# Patient Record
Sex: Female | Born: 1975 | Race: Black or African American | Hispanic: No | Smoking: Never smoker
Health system: Southern US, Community
[De-identification: ages and names within clinical notes are randomized; demographics above are authoritative.]

## PROBLEM LIST (undated history)

## (undated) DIAGNOSIS — N6002 Solitary cyst of left breast: Secondary | ICD-10-CM

## (undated) DIAGNOSIS — N644 Mastodynia: Secondary | ICD-10-CM

## (undated) DIAGNOSIS — M5417 Radiculopathy, lumbosacral region: Secondary | ICD-10-CM

## (undated) DIAGNOSIS — M609 Myositis, unspecified: Secondary | ICD-10-CM

## (undated) DIAGNOSIS — R928 Other abnormal and inconclusive findings on diagnostic imaging of breast: Secondary | ICD-10-CM

## (undated) DIAGNOSIS — N63 Unspecified lump in unspecified breast: Secondary | ICD-10-CM

## (undated) DIAGNOSIS — N760 Acute vaginitis: Secondary | ICD-10-CM

## (undated) DIAGNOSIS — N6009 Solitary cyst of unspecified breast: Secondary | ICD-10-CM

## (undated) DIAGNOSIS — N6019 Diffuse cystic mastopathy of unspecified breast: Secondary | ICD-10-CM

## (undated) DIAGNOSIS — B9689 Other specified bacterial agents as the cause of diseases classified elsewhere: Secondary | ICD-10-CM

## (undated) DIAGNOSIS — Z1231 Encounter for screening mammogram for malignant neoplasm of breast: Secondary | ICD-10-CM

## (undated) DIAGNOSIS — G629 Polyneuropathy, unspecified: Secondary | ICD-10-CM

## (undated) DIAGNOSIS — U071 COVID-19: Secondary | ICD-10-CM

## (undated) DIAGNOSIS — Z01419 Encounter for gynecological examination (general) (routine) without abnormal findings: Secondary | ICD-10-CM

## (undated) HISTORY — PX: LEG SURGERY: SHX1003

## (undated) HISTORY — PX: TUBAL LIGATION: SHX77

---

## 2000-09-13 NOTE — ED Provider Notes (Signed)
[facility]                      EMERGENCY DEPARTMENT TREATMENT REPORT   NAMEREKISHA, Bishop   MR #:  [id number]   BILLING #: [id number]        DOS: 09/13/2000  TIME:11:36 A   cc:   Primary Physician:   CHIEF COMPLAINT:   "My incision hurts."   HISTORY OF PRESENT ILLNESS:   A 25 year old female states she had a   bilateral tubal ligation performed on the 22nd.  She is having pain to the   wound site at her umbilicus.  She is eating fine.  No vomiting. She has had   a tiny bite of blood come from the wound but no pus.  States she has had no   fevers, chills, it just that the site seems to hurt today at work.   REVIEW OF SYSTEMS:   CONSTITUTIONAL:  No fever, chills, weight loss.   EYES: No visual symptoms.   ENT: No sore throat, runny nose or other URI symptoms.   ENDOCRINE:  No diabetic symptoms.   HEMATOLOGIC:  Has slight amount of bleeding from the wound site.   RESPIRATORY:  No cough, shortness of breath, or wheezing.   CARDIOVASCULAR:  No chest pain, chest pressure, or palpitations.   GASTROINTESTINAL:   Abdominal pain at the wound site but no vomiting or   diarrhea.  No loss of appetite.   GENITOURINARY:  No dysuria, frequency, or urgency. Except for bilateral   tubal ligation.   PAST MEDICAL HISTORY:  Tubal ligation.   SOCIAL HISTORY: She is alone.   MEDICATIONS:  Pain medication at home.   PHYSICAL EXAMINATION:   VITAL SIGNS:  Pulse 98, respiratory rate 20, temperature 98.4, blood   pressure 145/78.   GENERAL APPEARANCE:  The patient appears well-developed and well-nourished.   Appearance and behavior are age and situation appropriate.   HEENT:   Eyes:  Conjunctiva clear, lids normal.  Pupils equal, symmetrical,   and normally reactive.   NECK:  Supple, nontender, symmetrical, no masses or JVD, trachea midline,   thyroid not enlarged, nodular, or tender.   RESPIRATORY:  Clear and equal BS.  No respiratory distress, tachypnea, or   accessory muscle use.    CARDIOVASCULAR:  Heart regular, without murmurs, gallops, rubs, or thrills.   PMI not displaced.   GASTROINTESTINAL:   Abdomen is completely soft, nontender, nondistended.   Normal active bowel sounds.  Wound site is clean with no erythema.  No   warmth.  No edema.  No discharge.   SKIN:  Warm and dry without rashes. Except as above.   IMPRESSION/MANAGEMENT PLAN:   The patient with a well healing wound site   from a  bilateral tubal ligation. She appears very comfortable.  We will   add Motrin to her regimen.  We will be taking her off work today.  If she   develops any erythema or discharge we have asked her to follow with Dr.   Johny Chess.   FINAL DIAGNOSIS:   1.  Acute abdominal pain at surgical incision site.   DISPOSITION:   The patient is discharged home in stable condition, with   instructions to follow up with their regular doctor.  They are advised to   return immediately for any worsening or symptoms of concern.   Electronically Signed By:   Docia Furl, M.D. 09/17/2000 21:42   ____________________________  Docia Furl, M.D.   Dwaine Deter D:  09/13/2000 T:  09/16/2000  3:23 P   [id number]

## 2008-04-10 LAB — HM PAP SMEAR

## 2008-06-06 LAB — CBC WITH AUTOMATED DIFF
ABS. EOSINOPHILS: 0.1 10*3/uL (ref 0.0–0.4)
ABS. LYMPHOCYTES: 2.1 10*3/uL (ref 0.8–3.5)
ABS. MONOCYTES: 0.3 10*3/uL (ref 0–1.0)
ABS. NEUTROPHILS: 4.1 10*3/uL (ref 1.8–8.0)
BASOPHILS: 0 % (ref 0–3)
EOSINOPHILS: 1 % (ref 0–5)
HCT: 39.1 % (ref 36.0–46.0)
HGB: 13.1 g/dL (ref 12.0–16.0)
LYMPHOCYTES: 31 % (ref 20–51)
MCH: 31 PG (ref 25.0–35.0)
MCHC: 33.5 g/dL (ref 31.0–37.0)
MCV: 92.6 FL (ref 78.0–102.0)
MONOCYTES: 5 % (ref 2–9)
MPV: 9.1 FL (ref 7.4–10.4)
NEUTROPHILS: 63 % (ref 42–75)
PLATELET: 166 10*3/uL (ref 130–400)
RBC: 4.22 M/uL (ref 4.10–5.10)
RDW: 12.9 % (ref 11.5–14.5)
WBC: 6.6 10*3/uL (ref 4.5–13.0)

## 2008-06-06 LAB — VITAMIN B12: Vitamin B12: 305 pg/mL (ref 211–911)

## 2008-06-06 LAB — FOLATE: Folate: 13.6 ng/mL (ref 5.38–24.0)

## 2008-06-08 LAB — VITAMIN D, 25 HYDROXY: Vitamin D 25-Hydroxy: 10 ng/mL — ABNORMAL LOW (ref 30–80)

## 2008-09-14 LAB — URINALYSIS W/ RFLX MICROSCOPIC
Bilirubin: NEGATIVE
Blood: NEGATIVE
Glucose: NEGATIVE MG/DL
Ketone: NEGATIVE MG/DL
Leukocyte Esterase: NEGATIVE
Nitrites: NEGATIVE
Protein: NEGATIVE MG/DL
Specific gravity: 1.015 (ref 1.003–1.030)
Urobilinogen: 0.2 EU/DL (ref 0.2–1.0)
pH (UA): 6 (ref 5.0–8.0)

## 2008-09-14 LAB — HCG URINE, QL: HCG urine, QL: NEGATIVE

## 2008-09-15 LAB — WET PREP

## 2008-09-16 LAB — CHLAMYDIA/GC PCR
Chlamydia amplified: NEGATIVE
N. gonorrhea, amplified: NEGATIVE

## 2008-11-16 LAB — AMB POC URINALYSIS DIP STICK AUTO W/O MICRO
Bilirubin (UA POC): NEGATIVE
Blood (UA POC): NEGATIVE
Glucose (UA POC): NEGATIVE
Ketones (UA POC): NEGATIVE
Leukocyte esterase (UA POC): NEGATIVE
Nitrites (UA POC): NEGATIVE
Protein (UA POC): NEGATIVE mg/dL
Specific gravity (UA POC): 1.015 (ref 1.001–1.035)
Urobilinogen: 0.2
pH (UA POC): 6.5 (ref 4.6–8.0)

## 2008-11-16 LAB — AMB POC GONADOTROPIN, CHORIONIC (HCG); QUALITATIVE: HCG urine, Ql. (POC): NEGATIVE

## 2008-11-16 NOTE — Progress Notes (Signed)
HISTORY OF PRESENT ILLNESS  Rebekah Bishop is a 33 y.o. female.  HPI  Pt c/o 3 mo hx sharp intermittent abdominal pain.  Seen in ER in May.  Did not have menses since May 2010.  Was told in ER that she could have an ovarian cyst.  Her pain is in her back as well now.  Has not noticed a correlation with meals.  Supine position seems to help her pain in her abdomen but her back hurts regardless of her position.  Has taken [facility] powders with good relief of all of her pain.  This effect lasts all day but the pain is back the next day.    Review of Systems   Constitutional: Negative.  Negative for fever, chills and weight loss.   HENT: Negative.    Respiratory: Negative.    Cardiovascular: Negative.    Gastrointestinal: Positive for abdominal pain.   Genitourinary:        Irreg menses   Musculoskeletal: Positive for back pain.   Neurological: Negative.    Psychiatric/Behavioral: Negative.        Physical Exam   Constitutional: She is oriented to person, place, and time. She appears well-developed and well-nourished.   HENT:   Head: Normocephalic and atraumatic.   Right Ear: External ear normal.   Left Ear: External ear normal.   Nose: Nose normal.   Eyes: Conjunctivae and extraocular motions are normal.   Neck: Normal range of motion. Neck supple. No JVD present. Carotid bruit is not present. No thyromegaly present.   Cardiovascular: Normal rate, regular rhythm and normal heart sounds.    Pulmonary/Chest: Effort normal and breath sounds normal.   Abdominal: Soft. Bowel sounds are normal. She exhibits no distension and no mass. No tenderness. She has no rebound and no guarding.   Musculoskeletal: Normal range of motion.   Neurological: She is alert and oriented to person, place, and time. Coordination normal.   Skin: Skin is warm and dry.   Psychiatric: She has a normal mood and affect. Her behavior is normal. Judgment and thought content normal.       ASSESSMENT and PLAN  Rebekah Bishop was seen today for labs and irregular menses.     Diagnoses and associated orders for this visit:    Pain in the abdomen  - AMB POC URINALYSIS DIP STICK OR TABLET REAGENT AUTO W/O MICRO  - CBC WITH AUTOMATED DIFF; Future  - METABOLIC PANEL, COMPREHENSIVE; Future  - AMYLASE; Future  - LIPASE; Future  - GGT; Future  Trial sample ppi.  Will decide which imaging tests are most appropriate after labs available for review.    Irregular menses  - AMB POC HCG, QL  Potentially related to chronic abd pain as above.      Follow-up Disposition:  Return in about 1 week (around 11/23/2008) for follow up abdominal pain.

## 2008-11-16 NOTE — Progress Notes (Signed)
Patient c/o past few months with on going abdomen pain.  Patient went to [facility] ER 09/14/08 due to severe abdomen pain. Patient states she her last menses started  09/04/08.

## 2008-11-17 LAB — CBC WITH AUTOMATED DIFF
ABS. EOSINOPHILS: 0.1 10*3/uL (ref 0.0–0.4)
ABS. LYMPHOCYTES: 2.6 10*3/uL (ref 0.8–3.5)
ABS. MONOCYTES: 0.4 10*3/uL (ref 0–1.0)
ABS. NEUTROPHILS: 5.5 10*3/uL (ref 1.8–8.0)
BASOPHILS: 0 % (ref 0–3)
EOSINOPHILS: 1 % (ref 0–5)
HCT: 41.1 % (ref 36.0–46.0)
HGB: 13.9 g/dL (ref 12.0–16.0)
LYMPHOCYTES: 30 % (ref 20–51)
MCH: 30.8 PG (ref 25.0–35.0)
MCHC: 33.7 g/dL (ref 31.0–37.0)
MCV: 91.3 FL (ref 78.0–102.0)
MONOCYTES: 5 % (ref 2–9)
MPV: 8.6 FL (ref 7.4–10.4)
NEUTROPHILS: 64 % (ref 42–75)
PLATELET: 183 10*3/uL (ref 130–400)
RBC: 4.5 M/uL (ref 4.10–5.10)
RDW: 12.1 % (ref 11.5–14.5)
WBC: 8.7 10*3/uL (ref 4.5–13.0)

## 2008-11-17 LAB — METABOLIC PANEL, COMPREHENSIVE
A-G Ratio: 1.4 (ref 0.8–1.7)
ALT (SGPT): 32 U/L (ref 30–65)
AST (SGOT): 15 U/L (ref 15–37)
Albumin: 4.2 g/dL (ref 3.4–5.0)
Alk. phosphatase: 85 U/L (ref 50–136)
Anion gap: 6 mmol/L (ref 5–15)
BUN/Creatinine ratio: 17 (ref 12–20)
BUN: 12 MG/DL (ref 7–18)
Bilirubin, total: 0.3 MG/DL (ref 0.1–0.9)
CO2: 30 MMOL/L (ref 21–32)
Calcium: 9.3 MG/DL (ref 8.4–10.4)
Chloride: 99 MMOL/L — ABNORMAL LOW (ref 100–108)
Creatinine: 0.7 MG/DL (ref 0.6–1.3)
GFR est AA: >60 mL/min/{1.73_m2} (ref >60)
GFR est non-AA: >60 mL/min/{1.73_m2} (ref >60)
Globulin: 2.9 g/dL (ref 2.0–4.0)
Glucose: 80 MG/DL (ref 74–99)
Potassium: 4.3 MMOL/L (ref 3.5–5.5)
Protein, total: 7.1 g/dL (ref 6.4–8.2)
Sodium: 135 MMOL/L — ABNORMAL LOW (ref 136–145)

## 2008-11-17 LAB — LIPASE: Lipase: 106 U/L (ref 73–393)

## 2008-11-17 LAB — GGT: GGT: 32 U/L (ref 5–85)

## 2008-11-17 LAB — AMYLASE: Amylase: 86 U/L (ref 25–115)

## 2008-11-17 NOTE — Progress Notes (Signed)
Quick Note:    Please notify pt her labs were all normal except her sodium and chloride. These were just a little low and are not likely to be related to her abdominal pain. Her normal labs are reassuring. Please ask her if she tried the sample medication (protonix) yet and if it seems to have made any difference yet.  ______

## 2008-11-17 NOTE — Telephone Encounter (Signed)
Message copied by Christen Butter on Thu Nov 17, 2008  8:18 AM  ------       Message from: Modesto Charon       Created: Thu Nov 17, 2008  5:44 AM         Please notify pt her labs were all normal except her sodium and chloride.  These were just a little low and are not likely to be related to her abdominal pain.  Her normal labs are reassuring.  Please ask her if she tried the sample medication (protonix) yet and if it seems to have made any difference yet.

## 2008-11-17 NOTE — Telephone Encounter (Addendum)
11/17/08 at 10:16 a.m. Patient stated she will start samples of Protonix today-11/17/08, and will be taking one daily.  Patient will call us back on Monday, 11/21/08 and at that time will let us know how the sample Protonix is doing. Patient notified per Dr. Luiz Blare directions her labs were normal except for sodium and chloride-they were just a little low and were not likely to be the cause of her abdominal pain/ her labs are reassuring. Patient states she understands all.

## 2008-11-17 NOTE — Telephone Encounter (Signed)
Message copied by Christen Butter on Thu Nov 17, 2008 10:13 AM  ------       Message from: Cordelia Poche       Created: Thu Nov 17, 2008  9:38 AM       Regarding: graves         Darl Pikes ms Carino request you to call her  [phone]

## 2008-11-22 LAB — T4, FREE: T4, Free: 1.1 NG/DL (ref 0.89–1.76)

## 2008-11-22 LAB — ESTRADIOL: Estradiol: 59.79 pg/mL

## 2008-11-22 LAB — FSH AND LH
FSH: 5.8 m[IU]/mL
Luteinizing hormone: 29.7 m[IU]/mL

## 2008-11-22 LAB — TSH 3RD GENERATION: TSH: 0.98 u[IU]/mL (ref 0.51–6.27)

## 2008-12-30 LAB — AMB POC GONADOTROPIN, CHORIONIC (HCG); QUALITATIVE: HCG urine, Ql. (POC): NEGATIVE

## 2008-12-30 MED ORDER — MEDROXYPROGESTERONE 10 MG TAB
10 mg | ORAL_TABLET | Freq: Every day | ORAL | Status: AC
Start: 2008-12-30 — End: 2009-01-09

## 2008-12-30 NOTE — Progress Notes (Signed)
Chief Complaint   Patient presents with   ??? Nausea   ??? Other     abdominal pain     doesn't feel much better since last visit, said Protonix not effective

## 2008-12-30 NOTE — Progress Notes (Signed)
HISTORY OF PRESENT ILLNESS  Rebekah Bishop is a 33 y.o. female.  HPI  Still having abdominal pain from time to time.  Had some improvement with ppi after last visit.  Now out of samples.  Pt still with lower abdominal pain/ pelvic pain.  Pt has typically been constipated in the past.  For one week now, the pain has been worse.  Additionally, she notes that she has loose stools for the past week.  This is worse when she eats.      Has also had eval by gyn.  Work up was negative.  However, last menses was in April.    Review of Systems   Gastrointestinal: Positive for abdominal pain and diarrhea. Negative for blood in stool and melena.   Genitourinary:        Amenorrhea   All other systems reviewed and are negative.        Physical Exam   Nursing note and vitals reviewed.  Constitutional: She is oriented to person, place, and time. She appears well-developed and well-nourished.   HENT:   Head: Normocephalic and atraumatic.   Right Ear: External ear normal.   Left Ear: External ear normal.   Nose: Nose normal.   Eyes: Conjunctivae and extraocular motions are normal.   Neck: Normal range of motion. No JVD present.   Cardiovascular: Normal rate, regular rhythm and normal heart sounds.    Pulmonary/Chest: Effort normal and breath sounds normal.   Abdominal: Soft. Bowel sounds are normal. She exhibits no distension and no mass. Tenderness is present. She has no rebound and no guarding.   Musculoskeletal: Normal range of motion.   Neurological: She is alert and oriented to person, place, and time.   Skin: Skin is warm and dry.   Psychiatric: She has a normal mood and affect. Her behavior is normal. Judgment and thought content normal.       ASSESSMENT and PLAN  Rebekah Bishop was seen today for nausea and other.    Diagnoses and associated orders for this visit:    Abdominal pain  - REFERRAL TO GASTROENTEROLOGY    Amenorrhea  - AMB POC HCG, QL  - medroxyPROGESTERone (PROVERA) 10 mg tablet; Take 1 Tab by mouth daily for 10 days.   - PROLACTIN; Future    [name]  - REFERRAL TO GASTROENTEROLOGY        Follow-up Disposition:  Return in about 2 weeks (around 01/13/2009).

## 2008-12-30 NOTE — Telephone Encounter (Signed)
12/30/08 per Dr Luiz Blare directions the patient has an appointment set with Gastro/Dr. Lanell Matar office:[phone]/ fax [phone] office notes will be faxed,  for 01/27/09 at 2:30p.m. Be early to check in and Dr. Stevie Kern office will call the patient if they can get her in sooner.  Patient notified of appointment by voice/message.

## 2009-01-04 NOTE — Telephone Encounter (Signed)
01/04/09 per Dr Luiz Blare office chart notes were faxed to Dr. Thiwan/Gastroenterology [phone]. Dr.Thiwan's office number [phone].

## 2009-01-24 LAB — METABOLIC PANEL, COMPREHENSIVE
A-G Ratio: 1.3 (ref 0.8–1.7)
ALT (SGPT): 29 U/L — ABNORMAL LOW (ref 30–65)
AST (SGOT): 14 U/L — ABNORMAL LOW (ref 15–37)
Albumin: 4.1 g/dL (ref 3.4–5.0)
Alk. phosphatase: 85 U/L (ref 50–136)
Anion gap: 9 mmol/L (ref 5–15)
BUN/Creatinine ratio: 14 (ref 12–20)
BUN: 10 MG/DL (ref 7–18)
Bilirubin, total: 0.2 MG/DL (ref 0.1–0.9)
CO2: 27 MMOL/L (ref 21–32)
Calcium: 9.8 MG/DL (ref 8.4–10.4)
Chloride: 100 MMOL/L (ref 100–108)
Creatinine: 0.7 MG/DL (ref 0.6–1.3)
GFR est AA: >60 mL/min/{1.73_m2} (ref >60)
GFR est non-AA: >60 mL/min/{1.73_m2} (ref >60)
Globulin: 3.1 g/dL (ref 2.0–4.0)
Glucose: 82 MG/DL (ref 74–99)
Potassium: 4.5 MMOL/L (ref 3.5–5.5)
Protein, total: 7.2 g/dL (ref 6.4–8.2)
Sodium: 136 MMOL/L (ref 136–145)

## 2009-01-24 LAB — C REACTIVE PROTEIN, QT: C-Reactive protein: <0.2 MG/DL (ref <0.9)

## 2009-01-24 LAB — TSH 3RD GENERATION: TSH: 0.81 u[IU]/mL (ref 0.51–6.27)

## 2009-01-27 LAB — C DIFFICILE TOXIN A & B BY EIA: C. difficile toxin A&B: NEGATIVE

## 2009-01-28 LAB — CULTURE, STOOL

## 2009-01-28 LAB — GIARDIA LAMBLIA, AG, STOOL: Giardia Ag by EIA: NEGATIVE

## 2009-01-30 LAB — OVA & PARASITES, STOOL
O&P, Trichrome stain: NEGATIVE
Ova & Parasite exam: NEGATIVE

## 2009-07-05 MED ORDER — CETIRIZINE 10 MG TAB
10 mg | ORAL_TABLET | Freq: Every day | ORAL | Status: DC
Start: 2009-07-05 — End: 2010-09-05

## 2009-07-05 MED ORDER — FLUTICASONE 50 MCG/ACTUATION NASAL SPRAY, SUSP
50 mcg/actuation | Freq: Every day | NASAL | Status: DC
Start: 2009-07-05 — End: 2011-07-05

## 2009-07-05 MED ORDER — MONTELUKAST 10 MG TAB
10 mg | ORAL_TABLET | Freq: Every day | ORAL | Status: DC
Start: 2009-07-05 — End: 2009-08-24

## 2009-07-05 NOTE — Telephone Encounter (Signed)
Message copied by Christen Butter on Wed Jul 05, 2009 11:41 AM  ------       Message from: Cordelia Poche       Created: Wed Jul 05, 2009  9:55 AM       Regarding: graves       Contact: [phone]         Ms Speltz would like something called in for her allergies,either zyrtec or claritin,there are no refills on her previous.call to [facility] [phone]

## 2009-07-05 NOTE — Telephone Encounter (Signed)
07/05/2009 patient reached and understands her [employer] needs a Prior Auth for [employer], but the patient does not have Asthma she only has Allergies. Patient states when using Claritin D this did work for her allergies, but Claritin did not.

## 2009-07-11 NOTE — Telephone Encounter (Signed)
07/11/2009 patient reached per Dr Luiz Blare directions and directed by Dr.Graves that Claritin D she can use but not every day, and suggest she try adding nose spray ( Flonase ). Patient states she understands all.

## 2009-08-24 MED ORDER — METHYLPREDNISOLONE 4 MG TABS IN A DOSE PACK
4 mg | PACK | ORAL | Status: DC
Start: 2009-08-24 — End: 2010-04-25

## 2009-08-24 NOTE — Progress Notes (Signed)
Chief Complaint   Patient presents with   ??? Allergic Rhinitis   ??? Abdominal Pain     upper and lower abd   ??? Abdominal Cramping   ??? Nausea       HISTORY OF PRESENT ILLNESS  Rebekah Bishop is a 34 y.o. female.  HPI  Saw gi last fall.  Was dx with IBS.  Still not having regular bowel movements.  If she eats peaches, that will make her go.  She normally has a bowel movement near her menses.  The pain gets severe at times.  Is having pain this week.  Her most recent bowel movement was 4 days ago.  The last one before that was around 08/08/09.  Does have some pelvic pain near the time of her cycle.  Never has dyspareunia.      Has issues with her spring allergies.  Is taking zyrtec and flonase.  In spite of this, she has itchy eyes and throat, sinus ha, pnd.    Review of Systems   Constitutional: Negative.    HENT: Positive for congestion and sore throat.    Respiratory: Negative.    Cardiovascular: Negative.    Gastrointestinal: Positive for abdominal pain and constipation.   Musculoskeletal: Negative.    Neurological: Negative.    Psychiatric/Behavioral: Negative.        Physical Exam   Nursing note and vitals reviewed.  Constitutional: She is oriented to person, place, and time. She appears well-developed and well-nourished.   HENT:   Head: Normocephalic and atraumatic.   Right Ear: External ear normal.   Left Ear: External ear normal.   Nose: Nose normal.   Eyes: Conjunctivae and extraocular motions are normal.   Neck: Normal range of motion. Neck supple.   Cardiovascular: Normal rate, regular rhythm and normal heart sounds.  Exam reveals no gallop and no friction rub.    No murmur heard.  Pulmonary/Chest: Effort normal and breath sounds normal. She has no wheezes. She has no rhonchi. She has no rales.    Abdominal: Normal appearance and bowel sounds are normal. There is no organomegaly. Generalized and tenderness is present. She has no rigidity, no rebound, no guarding, no pain at McBurney's point and no Murphy's sign.   Musculoskeletal: Normal range of motion.   Neurological: She is alert and oriented to person, place, and time. Coordination normal.   Skin: Skin is warm and dry.   Psychiatric: She has a normal mood and affect. Her behavior is normal. Judgment and thought content normal.       ASSESSMENT and PLAN  Rebekah Bishop was seen today for allergic rhinitis, abdominal pain, abdominal cramping and nausea.    Diagnoses and associated orders for this visit:    [name]  Trial: amitiza 8 mg bid (samples given); benefiber po qd; align po qd.  Pt to call on May 9 for rx if amitiza effective and well tolerated.      Allergic rhinitis, cause unspecified  - methylPREDNIsolone (MEDROL DOSEPACK) 4 mg tablet; Take  by mouth. Per dose pack instructions  Cont flonase and zyrtec.  If no change, refer to ent for further eval.      Abdominal  pain, other specified site  Suspect due to IBS. Plan as above.         Follow-up Disposition:  Return in about 2 weeks (around 09/07/2009).

## 2009-12-24 LAB — URINALYSIS W/ RFLX MICROSCOPIC
Bilirubin: NEGATIVE
Blood: NEGATIVE
Glucose: NEGATIVE MG/DL
Ketone: NEGATIVE MG/DL
Leukocyte Esterase: NEGATIVE
Nitrites: NEGATIVE
Protein: NEGATIVE MG/DL
Specific gravity: 1.025 (ref 1.003–1.030)
Urobilinogen: 0.2 EU/DL (ref 0.2–1.0)
pH (UA): 7 (ref 5.0–8.0)

## 2009-12-24 LAB — METABOLIC PANEL, BASIC
Anion gap: 6 mmol/L (ref 5–15)
BUN/Creatinine ratio: 11 — ABNORMAL LOW (ref 12–20)
BUN: 10 MG/DL (ref 7–18)
CO2: 32 MMOL/L (ref 21–32)
Calcium: 10.3 MG/DL (ref 8.4–10.4)
Chloride: 99 MMOL/L — ABNORMAL LOW (ref 100–108)
Creatinine: 0.9 MG/DL (ref 0.6–1.3)
GFR est AA: >60 mL/min/{1.73_m2} (ref >60)
GFR est non-AA: >60 mL/min/{1.73_m2} (ref >60)
Glucose: 90 MG/DL (ref 74–99)
Potassium: 4 MMOL/L (ref 3.5–5.5)
Sodium: 137 MMOL/L (ref 130–146)

## 2009-12-24 LAB — CBC WITH AUTOMATED DIFF
ABS. BASOPHILS: 0 10*3/uL (ref 0.0–0.06)
ABS. EOSINOPHILS: 0.1 10*3/uL (ref 0.0–0.4)
ABS. LYMPHOCYTES: 1.9 10*3/uL (ref 0.9–3.6)
ABS. MONOCYTES: 0.5 10*3/uL (ref 0.05–1.2)
ABS. NEUTROPHILS: 4.9 10*3/uL (ref 1.8–8.0)
BASOPHILS: 0 % (ref 0–2)
EOSINOPHILS: 1 % (ref 0–5)
HCT: 43.6 % (ref 35.0–45.0)
HGB: 14.3 g/dL (ref 12.0–16.0)
LYMPHOCYTES: 26 % (ref 21–52)
MCH: 28.9 PG (ref 24.0–34.0)
MCHC: 32.8 g/dL (ref 31.0–37.0)
MCV: 88.3 FL (ref 74.0–97.0)
MONOCYTES: 7 % (ref 3–10)
MPV: 11.1 FL (ref 9.2–11.8)
NEUTROPHILS: 66 % (ref 40–73)
PLATELET: 213 10*3/uL (ref 135–420)
RBC: 4.94 M/uL (ref 4.20–5.30)
RDW: 12 % (ref 11.6–14.5)
WBC: 7.5 10*3/uL (ref 4.6–13.2)

## 2009-12-24 LAB — HCG URINE, QL: HCG urine, QL: NEGATIVE

## 2010-04-25 MED ORDER — METRONIDAZOLE 500 MG TAB
500 mg | ORAL_TABLET | Freq: Two times a day (BID) | ORAL | Status: AC
Start: 2010-04-25 — End: 2010-05-02

## 2010-04-25 MED ORDER — SUMATRIPTAN 50 MG TAB
50 mg | ORAL_TABLET | Freq: Once | ORAL | Status: DC | PRN
Start: 2010-04-25 — End: 2016-01-10

## 2010-04-25 MED ORDER — HYDROCODONE-ACETAMINOPHEN 5 MG-325 MG TAB
5-325 mg | ORAL_TABLET | Freq: Four times a day (QID) | ORAL | Status: DC | PRN
Start: 2010-04-25 — End: 2010-11-14

## 2010-04-25 NOTE — Progress Notes (Signed)
Chief Complaint   Patient presents with   ??? Headache     for past 3 months with off and on headaches        Patient c/o headaches off and on for the past 3 months  HISTORY OF PRESENT ILLNESS  Rebekah Bishop is a 35 y.o. female.  HPI  Here for eval of daily ha x 3 months.  Has a throbbing ha that usually occurs in the L temple.  Her vision gets blurry.  She initially thought it was allergy/sinus related.  Also thought it could be an eye issue but had no change when she wore her glasses.  It can occur any time of day.  The ha's do cause photophobia and phonophobia.  Does not have any warning; the ha's just suddenly start without any aura.  Her vision gets blurred after the ha has been going for a while.  No nausea or vomiting has been associated with this.  The l or r temple can be the site of the ha; occasionally both sides will hurt at once.  Has tried excedrin.  This takes the edge off but never completely relieves the ha.  Resting does seem to help.  Pt's sister has migraines.  Is unsure if either parent had migraines as both are deceased.  Pt does report that her mother died from a cerebral aneurysm at age 56, so this has been concerning her as a possible cause of her ha.      Pt cont to have vag d/c and odor; usually seems to recur immediately after her menses.  Would like to do something about this.        Review of Systems   Constitutional: Negative.  Negative for fever and chills.   Respiratory: Negative.    Cardiovascular: Negative.    Gastrointestinal: Negative.  Negative for nausea and vomiting.   Genitourinary:        Vag d/c with odor   Musculoskeletal: Negative.    Neurological: Positive for headaches. Negative for dizziness, sensory change, speech change and focal weakness.   Psychiatric/Behavioral: Negative.        Physical Exam   Nursing note and vitals reviewed.  Constitutional: She is oriented to person, place, and time. She appears well-developed and well-nourished.   HENT:    Head: Normocephalic and atraumatic.   Right Ear: External ear normal.   Left Ear: External ear normal.   Nose: Nose normal.   Eyes: Conjunctivae and EOM are normal. Pupils are equal, round, and reactive to light.   Neck: Normal range of motion. Neck supple. No JVD present. Carotid bruit is not present. No thyromegaly present.   Cardiovascular: Normal rate, regular rhythm and normal heart sounds.  Exam reveals no gallop and no friction rub.    No murmur heard.  Pulmonary/Chest: Effort normal and breath sounds normal. She has no wheezes. She has no rhonchi. She has no rales.   Genitourinary: Pelvic exam was performed with patient supine. There is no lesion on the right labia. There is no lesion on the left labia. No erythema around the vagina. Discharge found.        Copious thin white d/c in vag vault   Musculoskeletal: Normal range of motion.   Neurological: She is alert and oriented to person, place, and time. She has normal strength and normal reflexes. She displays normal reflexes. No cranial nerve deficit or sensory deficit. She exhibits normal muscle tone. Coordination and gait normal.   Reflex Scores:  Tricep reflexes are 2+ on the right side and 2+ on the left side.       Bicep reflexes are 2+ on the right side and 2+ on the left side.       Brachioradialis reflexes are 2+ on the right side and 2+ on the left side.       Patellar reflexes are 2+ on the right side and 2+ on the left side.  Skin: Skin is warm and dry.   Psychiatric: She has a normal mood and affect. Her behavior is normal. Judgment and thought content normal.       ASSESSMENT and PLAN  [name] was seen today for headache.    Diagnoses and associated orders for this visit:    Migraine  Samples of maxalt mlt and relpax given.  Pt carefully instructed re: use.     Rx: SUMAtriptan (IMITREX) 50 mg tablet; Take 1 Tab by mouth once as needed for Migraine for 1 dose. May repeat in 2 hours if needed.  - REFERRAL TO NEUROLOGY    - HYDROcodone-acetaminophen (NORCO) 5-325 mg per tablet; Take 1 Tab by mouth every six (6) hours as needed for Pain.    Bacterial vaginitis  Flagyl 500mg  po bid x 7 days.      Vaginitis  - MYCOPLASMA/UREAPLASMA RT-PCR    Family history of cerebral aneurysm  - REFERRAL TO NEUROLOGY        Follow-up Disposition:  Return in about 2 weeks (around 05/09/2010).

## 2010-04-26 NOTE — Telephone Encounter (Signed)
04/26/10 per Dr. Luiz Blare patient reached and notified of upcoming appointment with Neurology Dr Logan Bores office [phone] is scheduled for 05/14/10 at 10:30 am check in by 10:00am.  Patient states she understands all.  Chart information available routed, referral faxed.

## 2010-04-29 LAB — MYCOPLASMA/UREAPLASMA RT-PCR: Mycoplasma/Ureaplasma PCR: POSITIVE — AB

## 2010-04-29 NOTE — Progress Notes (Addendum)
Quick Note:    Please notify pt that the test we did was positive. As I discussed with her, i think a 10 day course of azithromycin will help with her problem. Please confirm which pharmacy she would like her med sent to.  ______

## 2010-04-30 NOTE — Telephone Encounter (Signed)
04/30/10 patient reached and informed per Dr.Graves her labs done 04/25/10 were abnormal.  Patient states she would like her medication electronically sent to [facility] in the chart [phone] and a call when she should pick this up.

## 2010-04-30 NOTE — Telephone Encounter (Signed)
Message copied by Christen Butter on Mon Apr 30, 2010  5:40 PM  ------       Message from: Modesto Charon       Created: Sun Apr 29, 2010  9:33 PM         Please notify pt that the test we did was positive.  As I discussed with her, i think a 10 day course of azithromycin will help with her problem.  Please confirm which pharmacy she would like her med sent to.

## 2010-05-01 MED ORDER — AZITHROMYCIN 250 MG TAB
250 mg | ORAL_TABLET | Freq: Every day | ORAL | Status: AC
Start: 2010-05-01 — End: 2010-05-10

## 2010-05-01 NOTE — Telephone Encounter (Signed)
05/01/10 per Dr. Luiz Blare the patient was informed she sent a electronic order for Azithromycin and to check with her pharmacy for pick up.  Patient states she understands all.

## 2010-05-01 NOTE — Telephone Encounter (Signed)
Message copied by Christen Butter on Tue May 01, 2010  4:00 PM  ------       Message from: Modesto Charon       Created: Sun Apr 29, 2010  9:33 PM         Please notify pt that the test we did was positive.  As I discussed with her, i think a 10 day course of azithromycin will help with her problem.  Please confirm which pharmacy she would like her med sent to.

## 2010-05-25 LAB — CBC WITH AUTOMATED DIFF
ABS. BASOPHILS: 0 10*3/uL (ref 0.0–0.06)
ABS. EOSINOPHILS: 0.2 10*3/uL (ref 0.0–0.4)
ABS. LYMPHOCYTES: 2.5 10*3/uL (ref 0.9–3.6)
ABS. MONOCYTES: 0.4 10*3/uL (ref 0.05–1.2)
ABS. NEUTROPHILS: 4.2 10*3/uL (ref 1.8–8.0)
BASOPHILS: 0 % (ref 0–2)
EOSINOPHILS: 2 % (ref 0–5)
HCT: 39.4 % (ref 35.0–45.0)
HGB: 13 g/dL (ref 12.0–16.0)
LYMPHOCYTES: 34 % (ref 21–52)
MCH: 28.9 PG (ref 24.0–34.0)
MCHC: 33 g/dL (ref 31.0–37.0)
MCV: 87.6 FL (ref 74.0–97.0)
MONOCYTES: 6 % (ref 3–10)
MPV: 11.5 FL (ref 9.2–11.8)
NEUTROPHILS: 58 % (ref 40–73)
PLATELET: 219 10*3/uL (ref 135–420)
RBC: 4.5 M/uL (ref 4.20–5.30)
RDW: 12 % (ref 11.6–14.5)
WBC: 7.3 10*3/uL (ref 4.6–13.2)

## 2010-05-25 LAB — METABOLIC PANEL, COMPREHENSIVE
A-G Ratio: 1.4 (ref 0.8–1.7)
ALT (SGPT): 35 U/L (ref 30–65)
AST (SGOT): 13 U/L — ABNORMAL LOW (ref 15–37)
Albumin: 4.1 g/dL (ref 3.4–5.0)
Alk. phosphatase: 98 U/L (ref 50–136)
Anion gap: 5 mmol/L (ref 5–15)
BUN/Creatinine ratio: 11 — ABNORMAL LOW (ref 12–20)
BUN: 8 MG/DL (ref 7–18)
Bilirubin, total: 0.4 MG/DL (ref 0.2–1.0)
CO2: 31 MMOL/L (ref 21–32)
Calcium: 9.3 MG/DL (ref 8.4–10.4)
Chloride: 106 MMOL/L (ref 100–108)
Creatinine: 0.7 MG/DL (ref 0.6–1.3)
GFR est AA: >60 mL/min/{1.73_m2} (ref >60)
GFR est non-AA: >60 mL/min/{1.73_m2} (ref >60)
Globulin: 3 g/dL (ref 2.0–4.0)
Glucose: 55 MG/DL — ABNORMAL LOW (ref 74–99)
Potassium: 4.6 MMOL/L (ref 3.5–5.5)
Protein, total: 7.1 g/dL (ref 6.4–8.2)
Sodium: 142 MMOL/L (ref 136–145)

## 2010-06-11 NOTE — Telephone Encounter (Signed)
Message copied by Christen Butter on Mon Jun 11, 2010 11:03 AM  ------       Message from: Bonney Leitz ANN       Created: Mon Jun 11, 2010 10:43 AM       Regarding: graves       Contact: [phone]         Rebekah Bishop had an MRI done and the neurologist (pt not sure of her name) did blood work. She was sending this to Dr. Luiz Blare. Does anything need to be prescribed for her thyroid?

## 2010-06-14 NOTE — Telephone Encounter (Signed)
06/14/10 per Dr. Luiz Blare patient reached and notified we have not received any lab results ordered by a Neurologist in regards to Thyroid results to review.  The patient reached at 5:00pm and states she was to understand by her Neurologist that the labs that they ordered for her, show a problem with her Thyroid results, the patient will contact her Neurology office who initially ordered the recent labs and have them fax [place] their results to Dr. Luiz Blare to review.  Patient states she understands all.

## 2010-09-05 MED ORDER — CETIRIZINE 10 MG TAB
10 mg | ORAL_TABLET | ORAL | Status: DC
Start: 2010-09-05 — End: 2011-07-05

## 2010-11-14 NOTE — Progress Notes (Signed)
HISTORY OF PRESENT ILLNESS  Rebekah Bishop is a 35 y.o. female.  Chief Complaint   Patient presents with   ??? Migraine   ??? Hospital Follow Up     patient went to [facility] facility on 11/06/10 due to migraine and chest pain, EKG performed and was normal         HPI  Patient here to follow up from [facility] visit 11/06/10 due to migraine and chest pain, patient had a EKG performed and it was normal.  She was told at the ER that her stress level could be contributing to ha's.  She has felt poorly as she deals with the loss of her mother.  Pt's mother passed away in September 29, 2007 but pt is just now dealing with this emotionally.  She has been crying more and has had decreased appetite.      Has been seen by neuro and dx with migraine ha.  She was told that her ha's could be due to thyroid dysfunction.  She was told to f/u with her pcp for this.  She was also started on vit D.      She has also seen her eye doctor who told her that her glasses could be part of the problem.  Her rx was stronger than she really needed. She is waiting for new glasses.    Review of Systems   Constitutional: Negative.    HENT: Negative.    Respiratory: Negative.    Cardiovascular: Negative.    Gastrointestinal: Negative.    Musculoskeletal: Negative.    Neurological: Negative.    Psychiatric/Behavioral: Positive for depression.        Decreased appetite       Physical Exam   Nursing note and vitals reviewed.  Constitutional: She is oriented to person, place, and time. She appears well-developed and well-nourished.   HENT:   Head: Normocephalic and atraumatic.   Right Ear: External ear normal.   Left Ear: External ear normal.   Nose: Nose normal.   Eyes: Conjunctivae and EOM are normal.   Neck: Normal range of motion.   Pulmonary/Chest: Effort normal.   Musculoskeletal: Normal range of motion.   Neurological: She is alert and oriented to person, place, and time. Coordination normal.   Skin: Skin is warm and dry.    Psychiatric: She has a normal mood and affect. Her speech is normal and behavior is normal. Judgment and thought content normal. Cognition and memory are normal.       ASSESSMENT and PLAN  Rebekah Bishop was seen today for migraine and hospital follow up.    Diagnoses and associated orders for this visit:    Migraine  Stable.  Care as per neuro.  Hopefully, will have further improvement with new glasses.      Abnormal thyroid blood test  - TSH, 3RD GENERATION  Call pt with result.    F/u as indicated.      Grief  Refer to psych for therapy.  Pt aware that she demonstrates some features of depression; not interested in medication at this time.      Fatigue  - TSH, 3RD GENERATION  - CBC WITH AUTOMATED DIFF  - VITAMIN D, 25 HYDROXY    Unspecified vitamin d deficiency  - VITAMIN D, 25 HYDROXY    Over 25 min spent with pt; > 50% of this time was in counseling re: grief, thyroid abnormalities, and ways to improve caloric intake in spite of decreased appetite.  Follow-up Disposition:  Return in about 2 months (around 01/15/2011).

## 2010-11-15 LAB — CBC WITH AUTOMATED DIFF
ABS. BASOPHILS: 0 10*3/uL (ref 0.0–0.2)
ABS. EOSINOPHILS: 0.1 10*3/uL (ref 0.0–0.4)
ABS. IMM. GRANS.: 0 10*3/uL (ref 0.0–0.1)
ABS. MONOCYTES: 0.4 10*3/uL (ref 0.1–1.0)
ABS. NEUTROPHILS: 4.1 10*3/uL (ref 1.8–7.8)
Abs Lymphocytes: 2.5 10*3/uL (ref 0.7–4.5)
BASOPHILS: 0 % (ref 0–3)
EOSINOPHILS: 1 % (ref 0–7)
HCT: 40.9 % (ref 34.0–46.6)
HGB: 13.7 g/dL (ref 11.1–15.9)
IMMATURE GRANULOCYTES: 0 % (ref 0–2)
Lymphocytes: 35 % (ref 14–46)
MCH: 29.5 pg (ref 26.6–33.0)
MCHC: 33.5 g/dL (ref 31.5–35.7)
MCV: 88 fL (ref 79–97)
MONOCYTES: 5 % (ref 4–13)
NEUTROPHILS: 59 % (ref 40–74)
PLATELET: 216 10*3/uL (ref 140–415)
RBC: 4.64 x10E6/uL (ref 3.77–5.28)
RDW: 12.8 % (ref 12.3–15.4)
WBC: 7.1 10*3/uL (ref 4.0–10.5)

## 2010-11-15 LAB — VITAMIN D, 25 HYDROXY: VITAMIN D, 25-HYDROXY: 22.4 ng/mL — ABNORMAL LOW (ref 30.0–100.0)

## 2010-11-15 LAB — PLEASE NOTE

## 2010-11-15 LAB — TSH 3RD GENERATION: TSH: 1.08 u[IU]/mL (ref 0.450–4.500)

## 2010-11-15 NOTE — Progress Notes (Signed)
Quick Note:    Please notify pt that her labs were normal except for the vitamin D level. Please call in vit D 50,000IU sig: 1 po weekly; disp: 4; rf: prn.  ______

## 2010-11-15 NOTE — Telephone Encounter (Signed)
11/15/10 per Dr. Luiz Blare patient was not available to speak to and a voice message left for her to return my call to discuss recent lab results done 11/14/10.  Per Dr. Luiz Blare called patient [facility] pharmacy a new medication Vitamin D 16109 international units- take 1 capsule by mouth once a week, Qty. 4/refills: PRN.  Patient reached at 2:45 pm and notified per Dr. Luiz Blare her lab results are normal except for her Vitamin D level is low, patient is to check with her pharmacy later today for pick up on a new script for Vitamin D 60454 international units, patient states she understands all.

## 2010-11-15 NOTE — Telephone Encounter (Signed)
Message copied by Christen Butter on Thu Nov 15, 2010  2:08 PM  ------       Message from: Modesto Charon       Created: Thu Nov 15, 2010  1:08 PM         Please notify pt that her labs were normal except for the vitamin D level.  Please call in vit D 50,000IU sig: 1 po weekly; disp: 4; rf: prn.

## 2011-03-15 LAB — URINALYSIS W/ RFLX MICROSCOPIC
Bilirubin: NEGATIVE
Blood: NEGATIVE
Glucose: NEGATIVE MG/DL
Ketone: NEGATIVE MG/DL
Leukocyte Esterase: NEGATIVE
Nitrites: NEGATIVE
Protein: NEGATIVE MG/DL
Specific gravity: 1.025 (ref 1.003–1.030)
Urobilinogen: 1 EU/DL (ref 0.2–1.0)
pH (UA): 7 (ref 5.0–8.0)

## 2011-03-15 NOTE — ED Provider Notes (Signed)
HPI Comments: 35 yo F presents to the ER c/o generalize fatigue for the past 2 days. Pt also reports associated left sided abdominal pain. Last BM today, normal. No sick contacts, fever, n/v, constipation, diarrhea, duysuria, heamtura, SOB. No further symptoms or complaints were expressed at this time.       No past medical history on file.     Past Surgical History   Procedure Date   ??? Hx gyn      tubal ligation   ??? Hx other surgical      novashur implant         No family history on file.     History     Social History   ??? Marital Status: Married     Spouse Name: N/A     Number of Children: N/A   ??? Years of Education: N/A     Occupational History   ??? Not on file.     Social History Main Topics   ??? Smoking status: Not on file   ??? Smokeless tobacco: Not on file   ??? Alcohol Use:    ??? Drug Use:    ??? Sexually Active:      Other Topics Concern   ??? Not on file     Social History Narrative   ??? No narrative on file                  ALLERGIES: Review of patient's allergies indicates no known allergies.      Review of Systems   Constitutional: Positive for fatigue. Negative for fever, chills and activity change.   HENT: Negative.  Negative for ear pain, congestion, sore throat, rhinorrhea, neck pain and neck stiffness.    Eyes: Negative.  Negative for pain, discharge and visual disturbance.   Respiratory: Negative.  Negative for apnea, cough, chest tightness and shortness of breath.    Cardiovascular: Negative.  Negative for chest pain.   Gastrointestinal: Positive for abdominal pain. Negative for nausea, vomiting, diarrhea, constipation and blood in stool.   Genitourinary: Negative.  Negative for dysuria, frequency and vaginal discharge.   Musculoskeletal: Negative.  Negative for myalgias.   Skin: Negative.  Negative for rash and wound.   Neurological: Negative.  Negative for dizziness and headaches.   Hematological: Negative.    Psychiatric/Behavioral: Negative.  Negative for suicidal ideas.       Filed Vitals:     03/15/11 1806 03/15/11 1855 03/15/11 1857   BP: 126/84 105/69    Pulse: 87     Temp: 98.6 ??F (37 ??C)     Resp: 15     Height: 5\' 7"  (1.702 m)     Weight: 65.772 kg (145 lb)     SpO2: 99%  100%            Physical Exam   Nursing note and vitals reviewed.  Constitutional: She is oriented to person, place, and time. She appears well-developed and well-nourished. No distress.   HENT:   Head: Normocephalic and atraumatic.   Mouth/Throat: Oropharynx is clear and moist.   Eyes: Conjunctivae are normal. Pupils are equal, round, and reactive to light. No scleral icterus.   Neck: Normal range of motion. Neck supple. No tracheal deviation present.   Cardiovascular: Normal heart sounds and intact distal pulses.    Pulmonary/Chest: Effort normal and breath sounds normal. No respiratory distress. She has no wheezes.   Abdominal: Soft. Bowel sounds are normal. She exhibits no distension. There is tenderness (left  inguinal area and mild tenderness in left side).   Musculoskeletal: Normal range of motion. She exhibits no edema.   Lymphadenopathy:     She has no cervical adenopathy.   Neurological: She is alert and oriented to person, place, and time. No cranial nerve deficit.   Skin: Skin is warm and dry. She is not diaphoretic.   Psychiatric: She has a normal mood and affect.        MDM     Differential Diagnosis; Clinical Impression; Plan:     35yo F with weakness, fatigue, and left side abdominal discomfort. No fever or cough. No radiating pain, or nausea. Denies constipation, or vaginal discharge. No dizziness.    Pt is in no distress, is AFVSS  O2 99%RA, HR87  Will check basic labs, UA, abdominal Xray. IVF    9:00PM  Pt resting, no new complaint.  Labs normal.  Dx constipation  Gave bottle mg citrate for home, Rx colace, education on increasing fiber in diet.  Amount and/or Complexity of Data Reviewed:   Clinical lab tests:  Ordered and reviewed  Tests in the radiology section of CPT??:  Ordered and reviewed     9:14 PM Xray: Flat abdomen: moderate amount of stool.    Procedures    Provider documentation is written by Daryel Gerald acting as a scribe for Dr. Ewing Schlein, DO.    I have reviewed the information recorded by the scribe and agree with its contents: Dr. Ewing Schlein, DO.

## 2011-03-15 NOTE — ED Notes (Signed)
Pt c/o llq pain radiates to back ongoing 1 day. She reports feeling weak and tired x 1 day. She reports normal bm, no difficulty with voiding. No vomiting at this time.

## 2011-03-15 NOTE — ED Notes (Signed)
Assumed care of this pt .

## 2011-03-15 NOTE — ED Notes (Signed)
I'm just feeling fatigued, its on and off. Pain in the left side of abdomen.

## 2011-03-15 NOTE — ED Notes (Signed)
Bedside and Verbal shift change report given to sylvia  (oncoming nurse) by Tobi Bastos (offgoing nurse).  Report given with SBAR and Procedure Summary.

## 2011-03-16 LAB — METABOLIC PANEL, BASIC
Anion gap: 4 mmol/L (ref 3.0–18)
BUN/Creatinine ratio: 13 (ref 12–20)
BUN: 10 MG/DL (ref 7.0–18)
CO2: 30 MMOL/L (ref 21–32)
Calcium: 8.9 MG/DL (ref 8.5–10.1)
Chloride: 102 MMOL/L (ref 100–108)
Creatinine: 0.8 MG/DL (ref 0.6–1.3)
GFR est AA: >60 mL/min/{1.73_m2} (ref >60)
GFR est non-AA: >60 mL/min/{1.73_m2} (ref >60)
Glucose: 82 MG/DL (ref 74–99)
Potassium: 3.5 MMOL/L (ref 3.5–5.5)
Sodium: 136 MMOL/L (ref 136–145)

## 2011-03-16 LAB — CBC WITH AUTOMATED DIFF
ABS. BASOPHILS: 0 10*3/uL (ref 0.0–0.06)
ABS. EOSINOPHILS: 0.1 10*3/uL (ref 0.0–0.4)
ABS. LYMPHOCYTES: 3 10*3/uL (ref 0.9–3.6)
ABS. MONOCYTES: 0.5 10*3/uL (ref 0.05–1.2)
ABS. NEUTROPHILS: 4.8 10*3/uL (ref 1.8–8.0)
BASOPHILS: 0 % (ref 0–2)
EOSINOPHILS: 1 % (ref 0–5)
HCT: 39.2 % (ref 35.0–45.0)
HGB: 13.1 g/dL (ref 12.0–16.0)
LYMPHOCYTES: 36 % (ref 21–52)
MCH: 29.6 PG (ref 24.0–34.0)
MCHC: 33.4 g/dL (ref 31.0–37.0)
MCV: 88.5 FL (ref 74.0–97.0)
MONOCYTES: 7 % (ref 3–10)
MPV: 10.7 FL (ref 9.2–11.8)
NEUTROPHILS: 56 % (ref 40–73)
PLATELET: 200 10*3/uL (ref 135–420)
RBC: 4.43 M/uL (ref 4.20–5.30)
RDW: 11.4 % — ABNORMAL LOW (ref 11.6–14.5)
WBC: 8.3 10*3/uL (ref 4.6–13.2)

## 2011-03-16 LAB — HCG URINE, QL: HCG urine, QL: NEGATIVE

## 2011-03-16 MED ORDER — MAGNESIUM CITRATE ORAL SOLN
ORAL | Status: AC
Start: 2011-03-16 — End: 2011-03-15
  Administered 2011-03-16: 02:00:00 via ORAL

## 2011-03-16 MED ORDER — KETOROLAC TROMETHAMINE 30 MG/ML INJECTION
30 mg/mL (1 mL) | INTRAMUSCULAR | Status: AC
Start: 2011-03-16 — End: 2011-03-15
  Administered 2011-03-16: 01:00:00 via INTRAVENOUS

## 2011-03-16 MED ORDER — DOCUSATE SODIUM 50 MG CAP
50 mg | ORAL_CAPSULE | Freq: Every day | ORAL | Status: DC | PRN
Start: 2011-03-16 — End: 2016-01-10

## 2011-03-16 MED ADMIN — sodium chloride 0.9 % bolus infusion 500 mL: INTRAVENOUS | @ 01:00:00 | NDC 00409798303

## 2011-07-05 MED ORDER — FLUTICASONE 50 MCG/ACTUATION NASAL SPRAY, SUSP
50 mcg/actuation | Freq: Every day | NASAL | Status: DC
Start: 2011-07-05 — End: 2012-08-26

## 2011-07-05 MED ORDER — CETIRIZINE 10 MG TAB
10 mg | ORAL_TABLET | Freq: Every day | ORAL | Status: DC
Start: 2011-07-05 — End: 2012-08-06

## 2011-07-05 MED ORDER — VALACYCLOVIR 500 MG TAB
500 mg | ORAL_TABLET | Freq: Every day | ORAL | Status: DC | PRN
Start: 2011-07-05 — End: 2012-08-26

## 2011-07-05 NOTE — Progress Notes (Signed)
Chief Complaint   Patient presents with   ??? Other     Vit D deficiency   ??? Allergic Rhinitis   ??? Other     HSV genital   ??? Migraine

## 2011-07-07 NOTE — Progress Notes (Signed)
HISTORY OF PRESENT ILLNESS  Rebekah Bishop is a 36 y.o. female.  HPI  Chief Complaint   Patient presents with   ??? Other     Vit D deficiency:chronic, with uncontrolled fatigue. The patient is taking medication as ordered but still reports fatigue. Denies depressive symptoms   ??? Allergic Rhinitis:chronic,controlled on therapy. requesting reflll of meidcation   ??? Other     HSV genital:chronic,stable. Requesting refill of medication used PRN   ???    Bone pain to lower extremities. Patient reports this as chronic,uncontrolled x 1 year. States that she was seen by ortho that stated she had nflammation. The patient states that the pain has continued and that she has tried NSAID therapy and even massage but all  Ineffective. Rates pain as 6/10. +fatigue. Denies calve edema, erythema, pain. den  /  Past Medical History   Diagnosis Date   ??? 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 Allergic rhinitis, cause unspecified 08/09/2008   ??? Unspecified vitamin D deficiency 08/09/2008     /  Current Outpatient Prescriptions on File Prior to Visit   Medication Sig Dispense Refill   ??? ergocalciferol (VITAMIN D2) 50,000 unit capsule Take 50,000 Units by mouth.    Indications: VITAMIN D DEFICIENCY       ??? docusate sodium (COLACE) 50 mg capsule Take 1 Cap by mouth daily as needed for Constipation.  15 Cap  0   ??? SUMAtriptan (IMITREX) 50 mg tablet Take 1 Tab by mouth once as needed for Migraine for 1 dose. May repeat in 2 hours if needed.  30 Tab  prn     Allergies   Allergen Reactions   ??? Phenergan (Promethazine) Other (comments)     "jittery"       Review of Systems   Constitutional: Positive for malaise/fatigue. Negative for fever, chills and weight loss.   HENT: Negative.    Respiratory: Negative for cough, hemoptysis, sputum production, shortness of breath and wheezing.    Cardiovascular: Negative for chest pain, palpitations, orthopnea, claudication and leg swelling.   Musculoskeletal: Positive for myalgias (lower extremities) and joint pain (lower  extremties). Negative for falls.   Neurological: Negative for dizziness, tingling, focal weakness and weakness.   Psychiatric/Behavioral: Negative for depression.     BP 125/86   Pulse 75   Temp(Src) 97 ??F (36.1 ??C) (Oral)   Ht 5\' 7"  (1.702 m)   Wt 153 lb 4 oz (69.514 kg)   BMI 24.00 kg/m2    Physical Exam   Constitutional: She is oriented to person, place, and time. She appears well-developed and well-nourished.   Neck: Normal range of motion. Neck supple. No thyromegaly present.   Cardiovascular: Normal rate, regular rhythm and normal heart sounds.  Exam reveals no gallop and no friction rub.    No murmur heard.  Pulmonary/Chest: Effort normal and breath sounds normal. No respiratory distress. She has no wheezes. She has no rales.   Musculoskeletal: Normal range of motion. She exhibits no edema and no tenderness.        Right lower leg: She exhibits bony tenderness (deep throbbing bone pain per patient). She exhibits no tenderness, no swelling, no edema, no deformity and no laceration.        Left lower leg: She exhibits no tenderness, no swelling, no edema, no deformity and no laceration. Bony tenderness: per patient, deep throbbing pain in bone BL per patient.   Lymphadenopathy:     She has no cervical adenopathy.   Neurological:  She is alert and oriented to person, place, and time.   Psychiatric: She has a normal mood and affect. Her behavior is normal. Judgment and thought content normal.       ASSESSMENT and PLAN  1. Fatigue :chronic,uncontrolled. Labs ordered. F/u 4 weeks METABOLIC PANEL, COMPREHENSIVE, VITAMIN D, 25 HYDROXY, ANA W/REFLEX IF POSITIVE, CBC WITH AUTOMATED DIFF, TSH, 3RD GENERATION, VITAMIN B12, LIPID PANEL   2. Unspecified vitamin D deficiency :chronic,uncontrolled per labs. Repeat labs ordered wth patient to continue supplementation therapy METABOLIC PANEL, COMPREHENSIVE, VITAMIN D, 25 HYDROXY, CBC WITH AUTOMATED DIFF, LIPID PANEL   3. Bilateral leg pain :chronic,uncontrolled. Labs and  ultrasound BL ordered to r/o DVT although this is unlikely presentation. Will revew labs and diagnostics to determine next step. F/u 4 weeks METABOLIC PANEL, COMPREHENSIVE, CBC WITH AUTOMATED DIFF, DUPLEX LOWER EXT VENOUS RIGHT, DUPLEX LOWER EXT VENOUS LEFT, VITAMIN B12, LIPID PANEL   4. Allergic rhinitis, cause unspecified :chronic, patient requested refill of medication fluticasone (FLONASE) 50 mcg/actuation nasal spray, cetirizine (ZYRTEC) 10 mg tablet, METABOLIC PANEL, COMPREHENSIVE, CBC WITH AUTOMATED DIFF   5. Genital HSV :chronc,patient request refill of medication valACYclovir (VALTREX) 500 mg tablet, METABOLIC PANEL, COMPREHENSIVE, CBC WITH AUTOMATED DIFF   6. Encounter for long-term (current) use of other medications  METABOLIC PANEL, COMPREHENSIVE, CBC WITH AUTOMATED DIFF     Follow-up Disposition:  Return in about 4 weeks (around 08/02/2011).

## 2011-07-19 NOTE — Procedures (Signed)
[facility]  *** FINAL REPORT ***    Name: Rebekah Bishop, Rebekah Bishop  MRN: [id number]  DOB: 08-21-1975  Visit: [id number]  Date: 17 Jul 2011    TYPE OF TEST: Peripheral Venous Testing    REASON FOR TEST  Pain in limb, Limb swelling    Right Leg:-  Deep venous thrombosis:           No  Superficial venous thrombosis:    No  Deep venous insufficiency:        No  Superficial venous insufficiency: No    Left Leg:-  Deep venous thrombosis:           No  Superficial venous thrombosis:    No  Deep venous insufficiency:        No  Superficial venous insufficiency: No      INTERPRETATION/FINDINGS  Right leg :  1. Deep vein(s) visualized include the common femoral, proximal  femoral, mid femoral, distal femoral, popliteal(above knee),  popliteal(fossa), popliteal(below knee), posterior tibial and peroneal   veins.  2. No evidence of deep venous thrombosis detected in the veins  visualized.  3. Superficial vein(s) visualized include the great saphenous vein.  4. No evidence of superficial thrombosis detected.  Left leg :  1. Deep vein(s) visualized include the common femoral, proximal  femoral, mid femoral, distal femoral, popliteal(above knee),  popliteal(fossa), popliteal(below knee) and posterior tibial veins.  2. No evidence of deep venous thrombosis detected in the veins  visualized.  3. Superficial vein(s) visualized include the great saphenous vein.  4. No evidence of superficial thrombosis detected.    ADDITIONAL COMMENTS    I have personally reviewed the data relevant to the interpretation of  this  study.    TECHNOLOGIST:  Bess Kinds, [facility], RVT/    PHYSICIAN:  Electronically signed by:  Livingston Diones MD  07/19/2011 12:21 PM

## 2011-07-19 NOTE — Procedures (Signed)
[facility]  *** FINAL REPORT ***    Name: ARRINGTON, Sury  MRN: [id number]  DOB: [address] 1977  Visit: [id number]  Date: 17 Jul 2011    TYPE OF TEST: Peripheral Venous Testing    REASON FOR TEST  Pain in limb, Limb swelling    Right Leg:-  Deep venous thrombosis:           No  Superficial venous thrombosis:    No  Deep venous insufficiency:        No  Superficial venous insufficiency: No    Left Leg:-  Deep venous thrombosis:           No  Superficial venous thrombosis:    No  Deep venous insufficiency:        No  Superficial venous insufficiency: No      INTERPRETATION/FINDINGS  Right leg :  1. Deep vein(s) visualized include the common femoral, proximal  femoral, mid femoral, distal femoral, popliteal(above knee),  popliteal(fossa), popliteal(below knee), posterior tibial and peroneal   veins.  2. No evidence of deep venous thrombosis detected in the veins  visualized.  3. Superficial vein(s) visualized include the great saphenous vein.  4. No evidence of superficial thrombosis detected.  Left leg :  1. Deep vein(s) visualized include the common femoral, proximal  femoral, mid femoral, distal femoral, popliteal(above knee),  popliteal(fossa), popliteal(below knee) and posterior tibial veins.  2. No evidence of deep venous thrombosis detected in the veins  visualized.  3. Superficial vein(s) visualized include the great saphenous vein.  4. No evidence of superficial thrombosis detected.    ADDITIONAL COMMENTS    I have personally reviewed the data relevant to the interpretation of  this  study.    TECHNOLOGIST:  Emmanuel Bathelemy, [facility], RVT/    PHYSICIAN:  Electronically signed by:  Lennix Rotundo MD  07/19/2011 12:21 PM

## 2011-07-26 MED ORDER — GABAPENTIN 100 MG CAP
100 mg | ORAL_CAPSULE | Freq: Three times a day (TID) | ORAL | Status: DC | PRN
Start: 2011-07-26 — End: 2014-05-05

## 2011-07-26 NOTE — Progress Notes (Signed)
HISTORY OF PRESENT ILLNESS  Rebekah Bishop is a 36 y.o. female.  HPI  Chief Complaint   Patient presents with   ??? Labs Only     done 07/10/11 to review   ??? Results     Venous Doppler Lower Extremities done 07/17/11 to review. Patient reports for f/u to chronic bilateral leg pain. Ultrasound was performed(negative). Lab workup negative as well, including ANA. The patient again reports pain last greater than 1 year. States that she was told it was inflammatory per XR and orthopedics. Has tried NSAIDS and Narcotics, ineffective. The patient reports pain as throbbing, bilaterally. She does report wearing high heels and has not tried to decrease wear of these shoes. Patient reported past hx of depression in the past last visit. Denies depression and symptoms. Reports pain 6/10; still denies trauma/edema/discoloration   Vit d deficiency: chronic,uncontrolled on long term medication therapy taken as ordered with associated chronic fatigue that has not improved  Past Medical History   Diagnosis Date   ??? 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 Allergic rhinitis, cause unspecified 08/09/2008   ??? Unspecified vitamin D deficiency 08/09/2008     Current Outpatient Prescriptions on File Prior to Visit   Medication Sig Dispense Refill   ??? fluticasone (FLONASE) 50 mcg/actuation nasal spray 2 Sprays by Both Nostrils route daily.  1 Bottle  3   ??? cetirizine (ZYRTEC) 10 mg tablet Take 1 Tab by mouth daily.  90 Tab  3   ??? valACYclovir (VALTREX) 500 mg tablet Take 1 Tab by mouth daily as needed.  30 Tab  6   ??? ergocalciferol (VITAMIN D2) 50,000 unit capsule Take 50,000 Units by mouth every seven (7) days. Indications: VITAMIN D DEFICIENCY       ??? docusate sodium (COLACE) 50 mg capsule Take 1 Cap by mouth daily as needed for Constipation.  15 Cap  0   ??? SUMAtriptan (IMITREX) 50 mg tablet Take 1 Tab by mouth once as needed for Migraine for 1 dose. May repeat in 2 hours if needed.  30 Tab  prn     Allergies   Allergen Reactions   ??? Phenergan  (Promethazine) Other (comments)     "jittery"       Review of Systems   Constitutional: Positive for malaise/fatigue.   Respiratory: Negative.    Cardiovascular: Negative.    Musculoskeletal: Negative for falls.        Chronic bilateral leg pain; below knee   Psychiatric/Behavioral: Negative for depression. The patient is not nervous/anxious.      BP 110/77   Pulse 68   Temp(Src) 97.2 ??F (36.2 ??C) (Oral)   Resp 16   Ht 5\' 7"  (1.702 m)   Wt 149 lb (67.586 kg)   BMI 23.34 kg/m2   LMP 07/24/2011    Physical Exam   Nursing note and vitals reviewed.  Constitutional: She appears well-developed and well-nourished.   Cardiovascular: Normal rate, regular rhythm and normal heart sounds.  Exam reveals no gallop and no friction rub.    No murmur heard.  Pulmonary/Chest: Effort normal and breath sounds normal. No respiratory distress. She has no wheezes. She has no rales.   Musculoskeletal: Normal range of motion. She exhibits no edema and no tenderness.        Right lower leg: She exhibits tenderness (interior, not to palpation). She exhibits no swelling, no edema and no deformity.        Left lower leg: She exhibits tenderness (interior, not to  palpation). She exhibits no bony tenderness, no swelling, no edema and no deformity.        Patient reports chronic ache/throbbing pain (interior pain)   Neurological: She has normal reflexes. She displays no atrophy. No sensory deficit. She exhibits normal muscle tone. Coordination and gait normal.   Skin: Skin is warm and dry. No erythema.       ASSESSMENT and PLAN  1. Bilateral leg pain :chronic,uncontrolled. Medication prescribed. Will determine if neuro cause to symptoms as patient is beginning to wonder. Patient does have past hx of depression,not on medication and refused medication for that. Chronic leg pain and chronic fatigue/lack of energy (vit d? Related). Suggested decreased wearing of high heels, pt does not want to attempt that at this time. 6 week f/u. Rheumatology?  gabapentin (NEURONTIN) 100 mg capsule, METABOLIC PANEL, COMPREHENSIVE   2. Neuropathic pain, leg, bilateral? :see above plan #1 gabapentin (NEURONTIN) 100 mg capsule, METABOLIC PANEL, COMPREHENSIVE   3. Unspecified vitamin D deficiency :chronic, uncontrolled on long time use of Vit D 50Kiu taken as ordered. Vit D dose increased, added vit D 2000u. Repeat lab in 6 weeks VITAMIN D, 25 HYDROXY, METABOLIC PANEL, COMPREHENSIVE, Cholecalciferol, Vitamin D3, (VITAMIN D3) 2,000 unit cap   4. Fatigue :chronic,uncontrolled. Vit d related? Depression? VITAMIN D, 25 HYDROXY, METABOLIC PANEL, COMPREHENSIVE, Cholecalciferol, Vitamin D3, (VITAMIN D3) 2,000 unit cap   5. Encounter for long-term (current) use of other medications  VITAMIN D, 25 HYDROXY, METABOLIC PANEL, COMPREHENSIVE, Cholecalciferol, Vitamin D3, (VITAMIN D3) 2,000 unit cap     Follow-up Disposition:  Return in about 6 weeks (around 09/06/2011).

## 2011-07-26 NOTE — Progress Notes (Signed)
Chief Complaint   Patient presents with   ??? Labs Only     done 07/10/11 to review   ??? Results     Venous Doppler Lower Extremities done 07/17/11 to review     Patient here to follow up on labs done 07/10/11 and review Venous Doppler Lower Extremities results done 07/17/11

## 2012-01-03 ENCOUNTER — Other Ambulatory Visit

## 2012-01-03 LAB — AMB POC URINALYSIS DIP STICK AUTO W/O MICRO
Bilirubin (UA POC): NEGATIVE
Blood (UA POC): NEGATIVE
Glucose (UA POC): NEGATIVE
Ketones (UA POC): NEGATIVE
Leukocyte esterase (UA POC): NEGATIVE
Nitrites (UA POC): NEGATIVE
Protein (UA POC): NEGATIVE mg/dL
Specific gravity (UA POC): 1.02 (ref 1.001–1.035)
Urobilinogen (UA POC): 0.2 (ref 0.2–1)
pH (UA POC): 6 (ref 4.6–8.0)

## 2012-01-03 NOTE — Progress Notes (Signed)
Chief Complaint   Patient presents with   ??? Well Woman     pap       Note:  Denies feeling depressed.

## 2012-01-03 NOTE — Progress Notes (Signed)
Subjective:   36 y.o. female for annual routine Pap and checkup.  Patient's last menstrual period was 12/13/2011.    Social History: has sex with males.  Pertinent past medical hstory: HSVII, no history of HTN, DVT, CAD, DM, liver disease, migraines or smoking.    Patient Active Problem List   Diagnosis Code   ??? 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 Allergic rhinitis, cause unspecified 477.9   ??? Fatigue 780.79   ??? Unspecified vitamin D deficiency 268.9     Patient Active Problem List    Diagnosis Date Noted   ??? 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 Allergic rhinitis, cause unspecified 08/09/2008   ??? Fatigue 08/09/2008   ??? Unspecified vitamin D deficiency 08/09/2008     Current Outpatient Prescriptions   Medication Sig Dispense Refill   ??? phenazopyridine (PYRIDIUM) 200 mg tablet Take 1 Tab by mouth three (3) times daily as needed for Pain for 3 days.  9 Tab  0   ??? gabapentin (NEURONTIN) 100 mg capsule Take 1 Cap by mouth three (3) times daily as needed.  90 Cap  1   ??? Cholecalciferol, Vitamin D3, (VITAMIN D3) 2,000 unit cap Take  by mouth.       ??? fluticasone (FLONASE) 50 mcg/actuation nasal spray 2 Sprays by Both Nostrils route daily.  1 Bottle  3   ??? cetirizine (ZYRTEC) 10 mg tablet Take 1 Tab by mouth daily.  90 Tab  3   ??? valACYclovir (VALTREX) 500 mg tablet Take 1 Tab by mouth daily as needed.  30 Tab  6   ??? docusate sodium (COLACE) 50 mg capsule Take 1 Cap by mouth daily as needed for Constipation.  15 Cap  0   ??? SUMAtriptan (IMITREX) 50 mg tablet Take 1 Tab by mouth once as needed for Migraine for 1 dose. May repeat in 2 hours if needed.  30 Tab  prn     Allergies   Allergen Reactions   ??? Phenergan (Promethazine) Other (comments)     "jittery"     Past Medical History   Diagnosis Date   ??? 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 Allergic rhinitis, cause unspecified 08/09/2008   ??? Unspecified vitamin D deficiency 08/09/2008     Past Surgical History   Procedure Date   ??? Hx tubal ligation      bilat   ??? Hx gyn      tubal ligation   ??? Hx other surgical       novashur implant     Family History   Problem Relation Age of Onset   ??? Thyroid Disease Sister      History   Substance Use Topics   ??? Smoking status: Never Smoker    ??? Smokeless tobacco: Never Used   ??? Alcohol Use: No        ROS:  Feeling well. No dyspnea or chest pain on exertion.  No abdominal pain, change in bowel habits, black or bloody stools.  No urinary tract symptoms. GYN ROS: normal menses, no abnormal bleeding, pelvic pain or discharge, no breast pain or new or enlarging lumps on self exam, she complains of urinary urgency and discomfort x 3 days. No neurological complaints.    Objective:   BP 122/80   Pulse 89   Temp 97.6 ??F (36.4 ??C) (Oral)   Resp 20   Ht 5\' 7"  (1.702 m)   Wt 154 lb (69.854 kg)   BMI 24.12 kg/m2   LMP 12/13/2011  The patient appears well, alert, oriented x 3, in no distress.  ENT normal.  Neck supple. No adenopathy or thyromegaly. PERLA. Lungs are clear, good air entry, no wheezes, rhonchi or rales. S1 and S2 normal, no murmurs, regular rate and rhythm. Abdomen soft without tenderness, guarding, mass or organomegaly. Extremities show no edema, normal peripheral pulses. Neurological is normal, no focal findings.    BREAST EXAM: breasts appear normal, no suspicious masses, no skin or nipple changes or axillary nodes    PELVIC EXAM: VULVA: normal appearing vulva with no masses, tenderness or lesions, VAGINA: normal appearing vagina with normal color and discharge, no lesions, CERVIX: normal appearing cervix without discharge or lesions UTERUS: uterus is normal size, shape, consistency and nontender, anteverted, ADNEXA: normal adnexa in size, nontender and no masses  Results for orders placed in visit on 01/03/12   AMB POC URINALYSIS DIP STICK AUTO W/O MICRO       Component Value Range    Color (UA POC) Yellow  (none)    Clarity (UA POC) Clear  (none)    Glucose (UA POC) Negative  (none)    Bilirubin (UA POC) Negative  (none)    Ketones (UA POC) Negative  (none)    Specific gravity (UA  POC) 1.020  1.001 - 1.035    Blood (UA POC) Negative  (none)    pH (UA POC) 6  4.6 - 8.0    Protein (UA POC) Negative  Negative mg/dL    Urobilinogen (UA POC) 0.2 mg/dL  0.2 - 1    Nitrites (UA POC) Negative  (none)    Leukocyte esterase (UA POC) Negative  (none)       Assessment/Plan:   well woman  pap smear  return annually or prn  1. Well woman exam with routine gynecological exam  PAP, IG, RFX HPV ASCUS ([id number])   2. Urinary frequency : acute, negative UTI. Medication prescribed with pt instructed to maintain fluids and cranberry juice AMB POC URINALYSIS DIP STICK AUTO W/O MICRO, phenazopyridine (PYRIDIUM) 200 mg tablet     Follow-up Disposition: Not on File.

## 2012-01-06 NOTE — Telephone Encounter (Signed)
Message copied by Raynelle Chary on Mon Jan 06, 2012  3:11 PM  ------       Message from: [place], Stann Mainland.       Created: Mon Jan 06, 2012  3:01 PM         Hi! Could you please let her know that her PAP was normal! Thanks!

## 2012-01-06 NOTE — Progress Notes (Signed)
Quick Note:    Hi! Could you please let her know that her PAP was normal! Thanks!  ______

## 2012-01-06 NOTE — Telephone Encounter (Signed)
Rebekah Bishop 36 y.o. female   Chief Complaint   Patient presents with   ??? Results     Per Np Vertell Novak, patient informed that her Pap results were normal.

## 2012-01-21 NOTE — Telephone Encounter (Signed)
Rebekah Bishop 36 y.o. female   Chief Complaint   Patient presents with   ??? Documentation     Per Np Vertell Novak, called patient to inform her that at there last visit the NP did not see any symptoms of Bacterial Vaginosis. The patient was evaluated and treated for a UTI. The patient may need to come into the office to be evaluated. Left message for the patient to return my call.

## 2012-01-21 NOTE — Telephone Encounter (Signed)
Message copied by Raynelle Chary on Tue Jan 21, 2012  2:17 PM  ------       Message from: [place], Stann Mainland.       Created: Tue Jan 21, 2012  1:44 PM       Contact: [phone]         Hi! Could you please call this pt. In regards to her phone call below. I remember she was having urinary issues. I checked her urine and that was normal. I gave her some pyridium for the urinary discomfort and sent that to her pharmacy. I don't remember anything about bacterial vaginosis and the PAP was completely normal, it did not pick up on any yeast or BV infection either which it is good at finding as well. Everything was normal at her last visit.Marland Kitchen Please ask her about her symptoms now; are they urinary or vaginal? She may have to come in. Let me know!       ----- Message -----          From: Rowe Clack          Sent: 01/21/2012  12:45 PM            To: Oletta Lamas, NP              Patient was seen on 9/13 for a pap and states that she mentioned she had signs of Bacterial vaginosis and thought you had prescribed something for that.  Pt states the pharmacy never received anything and she wants to know if you will prescribe a medication for her to treat this problem?  Do you remember this or does she need to be seen again?

## 2012-01-22 ENCOUNTER — Encounter

## 2012-03-23 NOTE — Addendum Note (Signed)
Addended by: Oletta Lamas on: 03/23/2012 03:08 PM     Modules accepted: Level of Service

## 2012-04-09 NOTE — Progress Notes (Signed)
PT DAILY TREATMENT NOTE     Patient Name: Rebekah Bishop  Date:04/09/2012  DOB: 11/15/1975  [x]   Patient DOB Verified  Payor: MEDICAID OF [place]  Plan[place]  Product Type: Medicaid     In time:1110  Out time:1153  Total Treatment Time (min): 43  Visit #: 1 of 9    Treatment Area: Thoracic or lumbosacral neuritis or radiculitis, unspecified [724.4]  Lumbago [724.2]    SUBJECTIVE  Pain Level (0-10 scale): 7/10  Any medication changes, allergies to medications, adverse drug reactions, diagnosis change, or new procedure performed?: []  No    [x]  Yes (see summary sheet for update)  Subjective functional status/changes:   []  No changes reported  See IE. Pt presents with chief c/o B calf pain and mild LBP where s/s consistent with lumbar radiculopathy.    OBJECTIVE  Therapeutic Exercise: [x]  see flow sheet     []  Other:_ (minutes) :15  Added/Changed Exercises: Created and instructed pt in HEP; handout provided and pt voiced understanding     Therapeutic Activity: education on creation of lumbar roll to decrease lumbar flexion while seated and improve posture, trial with lumbar roll (improved sxs), education on avoiding flexion and suggestions for postural improvement during ADL's     (minutes) :8    Patient Education: [x]  Review HEP    [x]  Exam findings, plan for future therapy sessions  []  positioning   []  body mechanics   []  transfers   []  heat/ice application   (minutes) :    Other Objective/Functional Measures: See IE.  Pain Level (0-10 scale) post treatment: 5/10    ASSESSMENT  [x]   See Plan of Care  []   See progress note/recertification  []   Patient will continue to benefit from skilled therapy to address remaining functional deficits:     PLAN  []   Upgrade activities as tolerated     []   Continue plan of care  []   Discharge due to:_  [x]  Other:_initiate POC      Cherly Beach Strozier, PT 04/09/2012  11:59 AM

## 2012-04-09 NOTE — Progress Notes (Signed)
[facility] ??? [facility]  [address]  [place], [place] [address]  [phone]   [phone] fax    Plan of Care/ Statement of Necessity for Physical Therapy Services    Patient name: Rebekah Bishop      Start of Care: 04/09/2012   Referral source: Andria Meuse II*      DOB: August 18, 1975   Diagnosis: Thoracic or lumbosacral neuritis or radiculitis, unspecified [724.4]  Lumbago [724.2]       Onset Date:x1 year    Prior Hospitalization:see medical history    Provider#: [id number]  Comorbidities: none  Prior Level of Function:independent without limitations, no hx LBP injuries           The Plan of Care and following information is based on the information from the initial evaluation.  Assessment/ key information: Pt is a 36 year old female who presents to IE with chief c/o B LE pain in the calf region with mild LBP. S/s consistent with lumbar radiculopathy, though SLR, slump, and prone knee bend tests were all negative. Pt presents with extension bias, as prone lying and press ups reduced pain and centralized sxs. Pt presents with mild lumbar ROM limitations, decreased hip strength B, decreased flexibility of hip flexors and quadriceps, decreased abdominal strength, impaired ADL abilities, and decreased activity tolerance. Pt would benefit from skilled PT to address these impairments.    Problem List: pain affecting function, decrease ROM, decrease strength, decrease ADL/ functional abilitiies, decrease activity tolerance and decrease flexibility/ joint mobility     Treatment Plan may include any combination of the following: Therapeutic exercise, Therapeutic activities, Neuromuscular re-education, Physical agent/modality, Manual therapy and Patient education    Patient / Family readiness to learn indicated by: asking questions, trying to perform skills and interest    Persons(s) to be included in education: patient (P)    Barriers to Learning/Limitations: no    Patient Goal (s): ???reduce leg  pain???    Rehabilitation Potential: good    Short Term Goals: To be accomplished in 1-2  weeks:  1. Pt will demonstrate independence in and voice compliance with HEP to allow pt to take an active role in the rehabilitation process.  2. Pt will voice decrease in radicular sxs by at least 25% to ease ability to complete ADL's.  Long Term Goals: To be accomplished in 3  weeks:  1. Pt will improve ODI score by at least 16 points to demonstrate significant functional improvements.  2. Pt will demonstrate lumbar pain-free AROM WFL to allow patient to bend to lift objects from floor for completion of ADL's.   3. Pt will demonstrate improvement in abdominal and hip strength by at least 1/3 MMT grade to decrease stress to low back associated with poor core and hip musculature and thus decrease lumbar pain with ADL's.  4.  Pt will voice decrease in radicular sxs by at least 50-75% to ease return to PLOF.    Frequency / Duration: Patient to be seen 2-3 times per week for 3  weeks.    Patient/ Caregiver education and instruction: activity modification, exercises and other use of lumbar roll while seated, avoidance of lumbar flexion    Cherly Beach Strozier, PT 04/09/2012 1:10 PM    ________________________________________________________________________    I certify that the above Therapy Services are being furnished while the patient is under my care. I agree with the treatment plan and certify that this therapy is necessary.    Physician's  Signature:____________________  Date:____________Time: _________    Please sign and return to [facility] ??? [facility]  [address]  [place], [place] [id number]  [phone]   [phone] fax

## 2012-05-01 NOTE — Progress Notes (Signed)
[facility] ??? [facility]  [address]  [place], [place] [address]  [phone]   [phone] fax    Discharge Summary    Rebekah Bishop        Date: 05/01/2012  04/13/1976  Rebekah Meuse II*    Diagnosis: LBP  Start of Care: 04/09/12  Visits from Start of Care: 1  Missed Visits: 3    Summary of Care: Pt is a 37 year old female who presented to IE with chief c/o LBP. Pt attended only IE prior to no-showing all treatment sessions. Goals have not been formally assessed at this time due to lack of attendance. Please see IE for most current patient status information. Thank you for this referral.     ASSESSMENT/RECOMMENDATIONS:  [x] Discontinue therapy: [] Patient has reached or is progressing toward set goals      [x] Patient is non-compliant or has abdicated      [] Due to lack of appreciable progress towards set goals    Santa Genera, PT 05/01/2012 4:47 PM  NOTE TO PHYSICIAN:  PLEASE COMPLETE THE ORDERS BELOW AND   FAX TO [facility]: [phone]  If you are unable to process this request in 24 hours please contact our office: [phone]      I have read the above report and request that my patient be discharged from therapy    Other:______________________________________________________________    Physician???s signature: ________________________________Date: _____Time:_____

## 2012-08-14 NOTE — Telephone Encounter (Signed)
Pt needs a refill on Cetirizine 10 mg 1 a day #30  [facility] [address].

## 2012-08-17 NOTE — Telephone Encounter (Signed)
Hi Rebekah Bishop..I have already sent this medication to her pharmacy in 4/17

## 2012-08-19 NOTE — Telephone Encounter (Signed)
Left message for patient stating that she should have refills remaining at the pharmacy.

## 2012-08-26 NOTE — Progress Notes (Signed)
Chief Complaint   Patient presents with   ??? Stress     x2 weeks with stress and loss of appetite   ??? Fatigue     x 2 weeks   ??? Medication Evaluation     to discuss Neurontin 100 mg    ??? Medication Refill     Patient c/o past 2 weeks with stress and fatigue, loss of apptetite  Patient would like to discuss medication Neurontin 100 mg she takes this for her leg pain and this is not working   Patient needs medication refill

## 2012-08-26 NOTE — Progress Notes (Signed)
HISTORY OF PRESENT ILLNESS  Rebekah Bishop is a 37 y.o. female.   Chief Complaint   Patient presents with   ??? Stress     x2 weeks with stress and loss of appetite   ??? Fatigue     x 2 weeks   ??? Medication Evaluation     to discuss Neurontin 100 mg    ??? Medication Refill       HPI  Patient c/o past 2 weeks with stress and fatigue, loss of appetite as she copes with a very difficult social stressor.  She is in the process of filing for divorce.    Patient would like to discuss medication Neurontin 100 mg.  she takes this for her leg pain but it is not working.     Patient needs medication refills  Review of Systems   Constitutional:        Decreased appetite   HENT: Negative.    Respiratory: Negative.    Cardiovascular: Negative.    Psychiatric/Behavioral: The patient is nervous/anxious and has insomnia.    All other systems reviewed and are negative.        Physical Exam  Physical Exam   Nursing note and vitals reviewed.  Constitutional: She is oriented to person, place, and time. She appears well-developed and well-nourished.   HENT:   Head: Normocephalic and atraumatic.   Right Ear: External ear normal.   Left Ear: External ear normal.   Nose: Nose normal.   Eyes: Conjunctivae and EOM are normal.   Neck: Normal range of motion. Neck supple. No JVD present. Carotid bruit is not present. No thyromegaly present.   Cardiovascular: Normal rate, regular rhythm, normal heart sounds and intact distal pulses.  Exam reveals no gallop and no friction rub.    No murmur heard.  Pulmonary/Chest: Effort normal and breath sounds normal. She has no wheezes. She has no rhonchi. She has no rales.   Abdominal: Soft. Bowel sounds are normal.   Musculoskeletal: Normal range of motion.   Neurological: She is alert and oriented to person, place, and time. Coordination normal.   Skin: Skin is warm and dry.   Psychiatric: She has a normal mood and affect. Her behavior is normal. Judgment and thought content normal.     ASSESSMENT and  PLAN  Rebekah Bishop was seen today for stress, fatigue, medication evaluation and medication refill.    Diagnoses and associated orders for this visit:    Unspecified vitamin D deficiency  - Cholecalciferol, Vitamin D3, (VITAMIN D3) 2,000 unit cap; Take 2,000 Units by mouth every seven (7) days.    Fatigue  - Cholecalciferol, Vitamin D3, (VITAMIN D3) 2,000 unit cap; Take 2,000 Units by mouth every seven (7) days.    Encounter for long-term (current) use of other medications  - Cholecalciferol, Vitamin D3, (VITAMIN D3) 2,000 unit cap; Take 2,000 Units by mouth every seven (7) days.    Allergic rhinitis, cause unspecified  - fluticasone (FLONASE) 50 mcg/actuation nasal spray; 2 Sprays by Both Nostrils route daily.    Genital HSV  - valACYclovir (VALTREX) 500 mg tablet; Take 1 Tab by mouth daily as needed.    Adjustment disorder; marital discord  - REFERRAL TO PSYCHOLOGY  Long discussion about pt's marriage and the factors that have led to its demise.  Pt seems encouraged by our conversation and agrees that therapy is likely to be more beneficial in this circumstance than medication.  Will f/u as needed.    Over 40 min  spent with pt; greater than 50% of this time spent in counseling as noted.      Other Orders  - traMADol (ULTRAM) 50 mg tablet; Take 1 Tab by mouth every eight (8) hours as needed for Pain.        Follow-up Disposition: 1 month; sooner prn

## 2012-08-27 NOTE — Telephone Encounter (Signed)
Chief Complaint   Patient presents with   ??? Referral Request     Psychology Lauris Chroman, [facility]      Patient informed she will need to make her own appointment and has been given the contact information to reach Psychology Lauris Chroman, [facility] Counsel[facility] in [place] facility [phone].  Patient directed to call our office back with the date and time she will be seen, so that we can update her referral in her chart, and states she understands all.

## 2012-09-01 NOTE — Telephone Encounter (Signed)
Chief Complaint   Patient presents with   ??? Appointment     patient called and states she has Psychology appointment with Asencion Partridge 09/03/12     09/01/12 patient called and states she has made her Psychology appointment with Lauris Chroman [phone] scheduled for 09/03/12. All understood.

## 2012-09-01 NOTE — Telephone Encounter (Signed)
FYI--Pt was asked to call once she made an appointment with psychology .  She has appointment scheduled with Asencion Partridge on Thursday 09/03/12.

## 2013-05-24 LAB — METABOLIC PANEL, BASIC
Anion gap: 9 mmol/L (ref 3.0–18)
BUN/Creatinine ratio: 10 — ABNORMAL LOW (ref 12–20)
BUN: 7 MG/DL (ref 7.0–18)
CO2: 32 mmol/L (ref 21–32)
Calcium: 9.4 MG/DL (ref 8.5–10.1)
Chloride: 104 mmol/L (ref 100–108)
Creatinine: 0.7 MG/DL (ref 0.6–1.3)
GFR est AA: >60 mL/min/{1.73_m2} (ref >60)
GFR est non-AA: >60 mL/min/{1.73_m2} (ref >60)
Glucose: 76 mg/dL (ref 74–99)
Potassium: 4.1 mmol/L (ref 3.5–5.5)
Sodium: 145 mmol/L (ref 136–145)

## 2013-05-24 LAB — URINALYSIS W/ RFLX MICROSCOPIC
Bilirubin: NEGATIVE
Blood: NEGATIVE
Glucose: NEGATIVE mg/dL
Ketone: NEGATIVE mg/dL
Nitrites: NEGATIVE
Protein: NEGATIVE mg/dL
Specific gravity: 1.01 (ref 1.003–1.030)
Urobilinogen: 0.2 EU/dL (ref 0.2–1.0)
pH (UA): 8 (ref 5.0–8.0)

## 2013-05-24 LAB — LIPASE: Lipase: 97 U/L (ref 73–393)

## 2013-05-24 LAB — HEPATIC FUNCTION PANEL
A-G Ratio: 1.3 (ref 0.8–1.7)
ALT (SGPT): 25 U/L (ref 13–56)
AST (SGOT): 18 U/L (ref 15–37)
Albumin: 4.3 g/dL (ref 3.4–5.0)
Alk. phosphatase: 96 U/L (ref 45–117)
Bilirubin, direct: 0.1 MG/DL (ref 0.0–0.2)
Bilirubin, total: 0.5 MG/DL (ref 0.2–1.0)
Globulin: 3.3 g/dL (ref 2.0–4.0)
Protein, total: 7.6 g/dL (ref 6.4–8.2)

## 2013-05-24 LAB — CBC WITH AUTOMATED DIFF
ABS. BASOPHILS: 0 10*3/uL (ref 0.0–0.06)
ABS. EOSINOPHILS: 0.1 10*3/uL (ref 0.0–0.4)
ABS. LYMPHOCYTES: 2.7 10*3/uL (ref 0.9–3.6)
ABS. MONOCYTES: 0.5 10*3/uL (ref 0.05–1.2)
ABS. NEUTROPHILS: 3.9 10*3/uL (ref 1.8–8.0)
BASOPHILS: 0 % (ref 0–2)
EOSINOPHILS: 1 % (ref 0–5)
HCT: 41.3 % (ref 35.0–45.0)
HGB: 13.5 g/dL (ref 12.0–16.0)
LYMPHOCYTES: 38 % (ref 21–52)
MCH: 29.2 PG (ref 24.0–34.0)
MCHC: 32.7 g/dL (ref 31.0–37.0)
MCV: 89.2 FL (ref 74.0–97.0)
MONOCYTES: 6 % (ref 3–10)
MPV: 11.1 FL (ref 9.2–11.8)
NEUTROPHILS: 55 % (ref 40–73)
PLATELET: 207 10*3/uL (ref 135–420)
RBC: 4.63 M/uL (ref 4.20–5.30)
RDW: 12 % (ref 11.6–14.5)
WBC: 7.2 10*3/uL (ref 4.6–13.2)

## 2013-05-24 LAB — HCG URINE, QL: HCG urine, QL: NEGATIVE

## 2013-05-24 LAB — URINE MICROSCOPIC ONLY: WBC: 1 /hpf (ref 0–4)

## 2013-05-24 MED ORDER — ONDANSETRON (PF) 4 MG/2 ML INJECTION
4 mg/2 mL | INTRAMUSCULAR | Status: AC
Start: 2013-05-24 — End: 2013-05-24
  Administered 2013-05-24: 20:00:00 via INTRAVENOUS

## 2013-05-24 MED ORDER — KETOROLAC TROMETHAMINE 30 MG/ML INJECTION
30 mg/mL (1 mL) | INTRAMUSCULAR | Status: AC
Start: 2013-05-24 — End: 2013-05-24
  Administered 2013-05-24: 20:00:00 via INTRAVENOUS

## 2013-05-24 MED ORDER — DICYCLOMINE 10 MG CAP
10 mg | ORAL | Status: AC
Start: 2013-05-24 — End: 2013-05-24
  Administered 2013-05-24: 20:00:00 via ORAL

## 2013-05-24 MED ADMIN — sodium chloride 0.9 % bolus infusion 1,000 mL: INTRAVENOUS | @ 20:00:00 | NDC 00409798309

## 2013-05-24 MED FILL — SODIUM CHLORIDE 0.9 % IV: INTRAVENOUS | Qty: 1000

## 2013-05-24 MED FILL — DICYCLOMINE 10 MG CAP: 10 mg | ORAL | Qty: 4

## 2013-05-24 MED FILL — ONDANSETRON HCL 4 MG/2 ML INTRAVENOUS SYRINGE: 4 mg/2 mL | INTRAVENOUS | Qty: 4

## 2013-05-24 MED FILL — KETOROLAC TROMETHAMINE 30 MG/ML INJECTION: 30 mg/mL (1 mL) | INTRAMUSCULAR | Qty: 1

## 2013-05-24 NOTE — ED Notes (Signed)
Patient denies change in abdominal pain after medication administration.  MD made aware.  Patient denies nausea.  C/o pain to IV site to right hand.  No swelling, infiltration, or redness noted to area.  IVF infusing without difficulty.

## 2013-05-24 NOTE — ED Provider Notes (Signed)
HPI Comments: Rebekah Bishop is a 38 y.o. female with a pmhx of Genital HSV, leg pain, and headache who presents to the ED with c/o generalized abdominal pain and cramping with associated nausea and fecal urgency 3 days ago (Friday). Pt reports an onset of generalized abdominal pain and cramping for the past 3 days. She notes associated increased fecal urgency with her sx stating that she feels the need to use the bathroom all the time but is unable to. She further states sx causing her nausea at this time. She denies any associated fevers, chills, or decreased appetite. Pt notes being in recent contact with sick co-workers. No other symptoms or complaints were presented at this time.         Patient is a 38 y.o. female presenting with abdominal pain. The history is provided by the patient.   Abdominal Pain          Past Medical History   Diagnosis Date   ??? 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 Allergic rhinitis, cause unspecified 08/09/2008   ??? Unspecified vitamin D deficiency 08/09/2008   ??? Distal radius fracture, left Sept 2010   ??? Leg pain    ??? Headache         Past Surgical History   Procedure Laterality Date   ??? Hx tubal ligation       bilat   ??? Hx gyn       tubal ligation   ??? Hx other surgical       novashur implant         Family History   Problem Relation Age of Onset   ??? Thyroid Disease Sister    ??? Arthritis-osteo Sister    ??? Arthritis-osteo Mother         History     Social History   ??? Marital Status: SINGLE     Spouse Name: N/A     Number of Children: N/A   ??? Years of Education: N/A     Occupational History   ??? Not on file.     Social History Main Topics   ??? Smoking status: Never Smoker    ??? Smokeless tobacco: Never Used   ??? Alcohol Use: No   ??? Drug Use: Yes     Special: Prescription, OTC   ??? Sexually Active: Not on file     Other Topics Concern   ??? Not on file     Social History Narrative    ** Merged History Encounter **              ALLERGIES: Phenergan      Review of Systems   Gastrointestinal: Positive for  abdominal pain.       Constitutional:  Denies malaise, fever, chills.   Head:  Denies injury.   Face:  Denies injury or pain.   ENMT:  Denies sore throat.   Neck:  Denies injury or pain.   Chest:  Denies injury.   Cardiac:  Denies chest pain or palpitations.   Respiratory:  Denies cough, wheezing, difficulty breathing, shortness of breath.   GI/ABD:  Denies injury, distention, vomiting, diarrhea. See HPI.   GU:  Denies injury, pain, dysuria.  Back:  Denies injury or pain.   Pelvis:  Denies injury or pain.   Extremity/MS:  Denies injury or pain.   Neuro:  Denies headache, LOC, dizziness, neurologic symptoms/deficits/paresthesias.   Skin: Denies injury, rash, itching or skin changes.      Filed Vitals:  05/24/13 1355 05/24/13 1450 05/24/13 1546 05/24/13 1548   BP:  122/88 138/75    Pulse:  66     Temp:  97.8 ??F (36.6 ??C)     Resp:  16     Height: 5\' 7"  (1.702 m)      Weight: 61.236 kg (135 lb)      SpO2:  100%  100%            Physical Exam     CONSTITUTIONAL: Alert, in no apparent distress; well-developed; well-nourished.   HEAD:  Normocephalic, atraumatic.   EYES: PERRL; EOM's intact.   ENTM: Nose: no rhinorrhea; Throat: mucous membranes moist. TMs-normal bilaterally. Posterior pharynx-normal.  Neck:  No JVD, supple without lymphadenopathy.  RESP: Chest clear, equal breath sounds.   CV: S1 and S2 WNL; No murmurs, gallops or rubs.   GI: No masses or organomegaly. Increased bowel sounds and increased guarding diffusely to abdomen.  UPPER EXT:  Normal inspection.   LOWER EXT: No edema.   NEURO: CN II-XII intact, strength 5/5 and sym, sensation intact.   SKIN: No rashes; Normal for age and stage.   PSYCH:  Alert and oriented, normal affect.        MDM     Differential Diagnosis; Clinical Impression; Plan:     Abd pain- AGE and dehydration and biliary colic and pancreatitis and appendicitis  Amount and/or Complexity of Data Reviewed:   Clinical lab tests:  Ordered and reviewed  Tests in the radiology section of CPT??:   Ordered and reviewed   Review and summarize past medical records:  Yes  Progress:   Patient progress:  Improved      Procedures    Medications ordered:   Medications   sodium chloride 0.9 % bolus infusion 1,000 mL (1,000 mL IntraVENous New Bag 05/24/13 1503)   ketorolac (TORADOL) injection 30 mg (30 mg IntraVENous Given 05/24/13 1508)   ondansetron (ZOFRAN) injection 8 mg (8 mg IntraVENous Given 05/24/13 1507)   dicyclomine (BENTYL) capsule 40 mg (40 mg Oral Given 05/24/13 1509)         Lab findings:  Labs Reviewed   URINALYSIS W/ RFLX MICROSCOPIC - Abnormal; Notable for the following:     Leukocyte Esterase TRACE (*)     All other components within normal limits   METABOLIC PANEL, BASIC - Abnormal; Notable for the following:     BUN/Creatinine ratio 10 (*)     All other components within normal limits   HCG URINE, QL   URINE MICROSCOPIC ONLY   CBC WITH AUTOMATED DIFF   LIPASE   HEPATIC FUNCTION PANEL       EKG interpretation:    X-Ray, CT or other radiology findings or impressions:  CT ABD PELV WO CONT    Final Result: IMPRESSION::          The bowel is unremarkable and the appendix is normal.  There is no obstructive    uropathy.        Moderately enlarged fibroid uterus.  Right adnexal tubal ligation clips without    other significant pelvic abnormality.        The study is otherwise grossly normal without apparent finding to correlate with    generalized abdominal pain.           Progress notes, Consult notes or additional Procedure notes:     Reevaluation of patient:   I have reevaluated patient. Patient is feeling better    Dispo:  Patient was discharged  in stable condition.  Patient is to return to emergency department with any new or worsening condition.        Scribe Attestation:    Glendora Score 05/24/2013 2:36 PM scribing for and in the presence of Dr. Sharilyn Sites, MD     Glendora Score     Scribe Attestation:   May 24, 2013 at 4:25 PM Erin E. Winebarger scribing for and in the presence of  Dr.Dajia Gunnels Baron Hamper, MD     Erin E. Winebarger, Water engineer:   I personally performed the services described in the documentation, reviewed the documentation, as recorded by the scribe in my presence, and it accurately and completely records my words and actions. May 24, 2013 at 4:26 PM - Sharilyn Sites, MD  (PROVIDER ATTESTATION)

## 2013-05-24 NOTE — ED Notes (Signed)
Discharge instructions reviewed with patient.  Patient voices understanding.  Patient advised to follow up as directed on discharge instructions.  Patient denies questions or concerns at discharge regarding discharge instructions.  No s/sx of apparent distress noted.  Armband removed and placed in shredder box.

## 2013-05-24 NOTE — ED Notes (Signed)
Warm blankets given.

## 2013-05-24 NOTE — ED Notes (Signed)
Pt co  Generalized abd pain and cramping since Friday

## 2014-04-02 LAB — AMB EXT HGBA1C: Hemoglobin A1c, External: 5.7

## 2014-04-29 ENCOUNTER — Encounter: Attending: Family Medicine | Primary: Family Medicine

## 2014-05-05 ENCOUNTER — Ambulatory Visit
Admit: 2014-05-05 | Discharge: 2014-05-05 | Payer: PRIVATE HEALTH INSURANCE | Attending: Family Medicine | Primary: Family Medicine

## 2014-05-05 DIAGNOSIS — R7309 Other abnormal glucose: Secondary | ICD-10-CM

## 2014-05-05 NOTE — Progress Notes (Signed)
Bernadene BellNiketa Deon Arrington 39 y.o. female   Chief Complaint   Patient presents with   ??? Abnormal Lab Results     report from OBGYN of elevated A1C     1. Have you been to the ER, urgent care clinic since your last visit?  Hospitalized since your last visit?No    2. Have you seen or consulted any other health care providers outside of the [facility] since your last visit?  Include any pap smears or colon screening. Yes When: 04-19-14 Where: OBGYN Reason for visit: ANNUAL [facility]OM[facility]

## 2014-05-05 NOTE — Patient Instructions (Addendum)
Please contact our office if you have any questions about your visit today.

## 2014-05-05 NOTE — Progress Notes (Signed)
HISTORY OF PRESENT ILLNESS  Rebekah Bishop is a 39 y.o. female.  Chief Complaint   Patient presents with   ??? Abnormal Lab Results     report from OBGYN of elevated A1C       HPI  The patient is here today with a C/O of abnormal lab results found at her OBGYN visit on 04-19-14.    Pt's Hgb A1C level was in the pre-diabetic range, and was instructed to see her PCP for further evaluation.   Pt had a burn on her leg; she told her provider that she has nerve damage in her leg, so the ob/gyn was concerned about possible dm.      Review of Systems   Constitutional: Negative.    HENT: Negative.    Respiratory: Negative.    Cardiovascular: Negative.    Neurological: Positive for sensory change.   All other systems reviewed and are negative.      Physical Exam  Physical Exam   Nursing note and vitals reviewed.  Constitutional: She is oriented to person, place, and time. She appears well-developed and well-nourished.   HENT:   Head: Normocephalic and atraumatic.   Right Ear: External ear normal.   Left Ear: External ear normal.   Nose: Nose normal.   Eyes: Conjunctivae and EOM are normal.   Neck: Normal range of motion. Neck supple. No JVD present. Carotid bruit is not present. No thyromegaly present.   Cardiovascular: Normal rate, regular rhythm, normal heart sounds and intact distal pulses.  Exam reveals no gallop and no friction rub.    No murmur heard.  Pulmonary/Chest: Effort normal and breath sounds normal. She has no wheezes. She has no rhonchi. She has no rales.   Abdominal: Soft. Bowel sounds are normal.   Musculoskeletal: Normal range of motion.   Neurological: She is alert and oriented to person, place, and time. Coordination normal.   Skin: Skin is warm and dry.   Psychiatric: She has a normal mood and affect. Her behavior is normal. Judgment and thought content normal.     ASSESSMENT and PLAN  Rebekah Bishop was seen today for abnormal lab results.    Diagnoses and all orders for this visit:     Elevated hemoglobin A1c  Orders:  -     AMB POC HEMOGLOBIN A1C: 5.1.  A1c wnl.  Discussed importance of staying active.      Neuropathic pain, leg, bilateral  Discussed use of gabapentin and need to titrate dosage.      Follow-up Disposition: prn

## 2014-05-06 ENCOUNTER — Encounter: Attending: Family Medicine | Primary: Family Medicine

## 2014-05-06 LAB — AMB POC HEMOGLOBIN A1C: Hemoglobin A1c (POC): 5.1 %

## 2014-10-21 ENCOUNTER — Encounter

## 2014-10-27 ENCOUNTER — Inpatient Hospital Stay: Admit: 2014-10-27 | Payer: BLUE CROSS/BLUE SHIELD | Attending: Family Medicine | Primary: Family Medicine

## 2014-10-27 ENCOUNTER — Encounter

## 2014-10-27 DIAGNOSIS — N644 Mastodynia: Secondary | ICD-10-CM

## 2014-11-03 ENCOUNTER — Encounter

## 2014-11-04 ENCOUNTER — Ambulatory Visit: Payer: BLUE CROSS/BLUE SHIELD | Primary: Family Medicine

## 2014-11-11 ENCOUNTER — Inpatient Hospital Stay: Admit: 2014-11-11 | Payer: BLUE CROSS/BLUE SHIELD | Attending: Specialist | Primary: Family Medicine

## 2014-11-11 ENCOUNTER — Inpatient Hospital Stay: Payer: BLUE CROSS/BLUE SHIELD | Attending: Specialist | Primary: Family Medicine

## 2014-11-11 DIAGNOSIS — N63 Unspecified lump in unspecified breast: Secondary | ICD-10-CM

## 2014-11-11 DIAGNOSIS — N6011 Diffuse cystic mastopathy of right breast: Secondary | ICD-10-CM

## 2014-11-11 MED ORDER — LIDOCAINE-EPINEPHRINE 1 %-1:100,000 IJ SOLN
1 %-:00,000 | INTRAMUSCULAR | Status: DC | PRN
Start: 2014-11-11 — End: 2014-11-15
  Administered 2014-11-11: 13:00:00 via INTRADERMAL

## 2014-11-11 MED ORDER — LIDOCAINE HCL 1 % (10 MG/ML) IJ SOLN
10 mg/mL (1 %) | INTRAMUSCULAR | Status: DC | PRN
Start: 2014-11-11 — End: 2014-11-15
  Administered 2014-11-11: 13:00:00 via INTRADERMAL

## 2014-11-11 MED FILL — LIDOCAINE HCL 1 % (10 MG/ML) IJ SOLN: 10 mg/mL (1 %) | INTRAMUSCULAR | Qty: 5

## 2014-11-11 MED FILL — XYLOCAINE WITH EPINEPHRINE 1 %-1:100,000 INJECTION SOLUTION: 1 %-:00,000 | INTRAMUSCULAR | Qty: 25

## 2014-11-11 NOTE — Procedures (Signed)
Procedure performed without any problems.

## 2014-11-11 NOTE — Progress Notes (Signed)
Patient for right breast ultrasound guided cyst aspiration.. Procedure explained with risks. Consent obtained.

## 2014-11-11 NOTE — H&P (Signed)
BRIEF HISTORY AND PHYSICAL    Admitting Diagnosis/Procedure Indications: Breast Biopsy    Allergies:      Allergies   Allergen Reactions   ??? Phenergan [Promethazine] Other (comments)     "jittery"       Current Meds:       Current Outpatient Prescriptions on File Prior to Encounter   Medication Sig Dispense Refill   ??? gabapentin (NEURONTIN) 100 mg capsule Take 3 Caps by mouth nightly. 90 Cap 1   ??? ondansetron (ZOFRAN ODT) 4 mg disintegrating tablet Take 1-2 tablets every 6-8 hours as needed for nausea and vomiting. 10 tablet 1   ??? Cholecalciferol, Vitamin D3, (VITAMIN D3) 2,000 unit cap Take 2,000 Units by mouth every seven (7) days. 90 Cap 3   ??? fluticasone (FLONASE) 50 mcg/actuation nasal spray 2 Sprays by Both Nostrils route daily. 1 Bottle 3   ??? valACYclovir (VALTREX) 500 mg tablet Take 1 Tab by mouth daily as needed. 90 Tab 3   ??? docusate sodium (COLACE) 50 mg capsule Take 1 Cap by mouth daily as needed for Constipation. 15 Cap 0   ??? SUMAtriptan (IMITREX) 50 mg tablet Take 1 Tab by mouth once as needed for Migraine for 1 dose. May repeat in 2 hours if needed. 30 Tab prn     No current facility-administered medications on file prior to encounter.           Data:    BP 114/73 mmHg   Pulse 78   Temp(Src) 98.2 ??F (36.8 ??C)   SpO2 99%:    Physical Exam:   General Appearance: Appears in no acute distress.   Mental Status: Alert and oriented x 3  Lungs: Clear   Heart: Regular rate and rhythm     Plan: Core breast biopsy      Cleon GustinMichael J Raygan Skarda, MD  November 11, 2014  10:20 AM

## 2014-11-16 ENCOUNTER — Encounter

## 2015-05-05 ENCOUNTER — Inpatient Hospital Stay: Payer: BLUE CROSS/BLUE SHIELD | Attending: Specialist | Primary: Family Medicine

## 2015-05-05 ENCOUNTER — Encounter: Primary: Family Medicine

## 2016-01-11 ENCOUNTER — Encounter: Primary: Family Medicine

## 2016-01-11 ENCOUNTER — Inpatient Hospital Stay: Payer: MEDICAID

## 2016-01-11 LAB — GLUCOSE, POC: Glucose (POC): 83 mg/dL (ref 65–105)

## 2016-01-11 MED ORDER — HYDRALAZINE 20 MG/ML IJ SOLN
20 mg/mL | INTRAMUSCULAR | Status: DC | PRN
Start: 2016-01-11 — End: 2016-01-11

## 2016-01-11 MED ORDER — NALOXONE 0.4 MG/ML INJECTION
0.4 mg/mL | INTRAMUSCULAR | Status: DC | PRN
Start: 2016-01-11 — End: 2016-01-11

## 2016-01-11 MED ORDER — MIDAZOLAM 1 MG/ML IJ SOLN
1 mg/mL | INTRAMUSCULAR | Status: DC | PRN
Start: 2016-01-11 — End: 2016-01-11
  Administered 2016-01-11: 17:00:00 via INTRAVENOUS

## 2016-01-11 MED ORDER — PROPOFOL 10 MG/ML IV EMUL
10 mg/mL | INTRAVENOUS | Status: DC | PRN
Start: 2016-01-11 — End: 2016-01-11
  Administered 2016-01-11 (×4): via INTRAVENOUS

## 2016-01-11 MED ORDER — ONDANSETRON (PF) 4 MG/2 ML INJECTION
4 mg/2 mL | INTRAMUSCULAR | Status: AC
Start: 2016-01-11 — End: ?

## 2016-01-11 MED ORDER — DIPHENHYDRAMINE HCL 50 MG/ML IJ SOLN
50 mg/mL | Freq: Once | INTRAMUSCULAR | Status: DC | PRN
Start: 2016-01-11 — End: 2016-01-11

## 2016-01-11 MED ORDER — LABETALOL 5 MG/ML IV SOLN
5 mg/mL | INTRAVENOUS | Status: DC | PRN
Start: 2016-01-11 — End: 2016-01-11

## 2016-01-11 MED ORDER — LIDOCAINE HCL 1 % (10 MG/ML) IJ SOLN
10 mg/mL (1 %) | INTRAMUSCULAR | Status: AC
Start: 2016-01-11 — End: ?

## 2016-01-11 MED ORDER — FENTANYL CITRATE (PF) 50 MCG/ML IJ SOLN
50 mcg/mL | INTRAMUSCULAR | Status: AC
Start: 2016-01-11 — End: ?

## 2016-01-11 MED ORDER — FENTANYL CITRATE (PF) 50 MCG/ML IJ SOLN
50 mcg/mL | INTRAMUSCULAR | Status: DC | PRN
Start: 2016-01-11 — End: 2016-01-11

## 2016-01-11 MED ORDER — FENTANYL CITRATE (PF) 50 MCG/ML IJ SOLN
50 mcg/mL | INTRAMUSCULAR | Status: DC | PRN
Start: 2016-01-11 — End: 2016-01-11
  Administered 2016-01-11 (×2): via INTRAVENOUS

## 2016-01-11 MED ORDER — PROPOFOL 10 MG/ML IV EMUL
10 mg/mL | INTRAVENOUS | Status: AC
Start: 2016-01-11 — End: ?

## 2016-01-11 MED ORDER — GLYCOPYRROLATE 0.2 MG/ML IJ SOLN
0.2 mg/mL | Freq: Once | INTRAMUSCULAR | Status: DC
Start: 2016-01-11 — End: 2016-01-11

## 2016-01-11 MED ORDER — HYDROCODONE-ACETAMINOPHEN 5 MG-325 MG TAB
5-325 mg | ORAL_TABLET | ORAL | 0 refills | Status: DC | PRN
Start: 2016-01-11 — End: 2019-12-24

## 2016-01-11 MED ORDER — LIDOCAINE (PF) 20 MG/ML (2 %) IV SYRINGE
100 mg/5 mL (2 %) | INTRAVENOUS | Status: AC
Start: 2016-01-11 — End: ?

## 2016-01-11 MED ORDER — ROPIVACAINE (PF) 5 MG/ML (0.5 %) INJECTION
5 mg/mL (0. %) | INTRAMUSCULAR | Status: AC
Start: 2016-01-11 — End: ?

## 2016-01-11 MED ORDER — PROMETHAZINE IN NS 6.25 MG/50 ML IV PIGGY BAG
6.25 mg/50 ml | INTRAVENOUS | Status: DC | PRN
Start: 2016-01-11 — End: 2016-01-11

## 2016-01-11 MED ORDER — LIDOCAINE HCL 1 % (10 MG/ML) IJ SOLN
10 mg/mL (1 %) | INTRAMUSCULAR | Status: DC | PRN
Start: 2016-01-11 — End: 2016-01-11
  Administered 2016-01-11: 17:00:00 via INTRADERMAL

## 2016-01-11 MED ORDER — DEXAMETHASONE SODIUM PHOSPHATE 4 MG/ML IJ SOLN
4 mg/mL | INTRAMUSCULAR | Status: DC | PRN
Start: 2016-01-11 — End: 2016-01-11
  Administered 2016-01-11: 17:00:00 via INTRAVENOUS

## 2016-01-11 MED ORDER — ACETAMINOPHEN 1,000 MG/100 ML (10 MG/ML) IV
1000 mg/100 mL (10 mg/mL) | Freq: Once | INTRAVENOUS | Status: DC
Start: 2016-01-11 — End: 2016-01-11

## 2016-01-11 MED ORDER — ROPIVACAINE (PF) 5 MG/ML (0.5 %) INJECTION
5 mg/mL (0. %) | INTRAMUSCULAR | Status: DC | PRN
Start: 2016-01-11 — End: 2016-01-11
  Administered 2016-01-11: 18:00:00 via SUBCUTANEOUS

## 2016-01-11 MED ORDER — ONDANSETRON (PF) 4 MG/2 ML INJECTION
4 mg/2 mL | INTRAMUSCULAR | Status: DC | PRN
Start: 2016-01-11 — End: 2016-01-11
  Administered 2016-01-11: 17:00:00 via INTRAVENOUS

## 2016-01-11 MED ORDER — MIDAZOLAM 1 MG/ML IJ SOLN
1 mg/mL | INTRAMUSCULAR | Status: AC
Start: 2016-01-11 — End: ?

## 2016-01-11 MED ORDER — OXYCODONE 5 MG TAB
5 mg | ORAL | Status: DC | PRN
Start: 2016-01-11 — End: 2016-01-11

## 2016-01-11 MED ORDER — SODIUM CHLORIDE 0.9 % IV PIGGY BACK
2 gram | Freq: Once | INTRAVENOUS | Status: AC
Start: 2016-01-11 — End: 2016-01-11
  Administered 2016-01-11: 17:00:00 via INTRAVENOUS

## 2016-01-11 MED ORDER — LACTATED RINGERS IV
INTRAVENOUS | Status: DC
Start: 2016-01-11 — End: 2016-01-11
  Administered 2016-01-11: 17:00:00 via INTRAVENOUS

## 2016-01-11 MED ORDER — FLUMAZENIL 0.1 MG/ML IV SOLN
0.1 mg/mL | INTRAVENOUS | Status: DC | PRN
Start: 2016-01-11 — End: 2016-01-11

## 2016-01-11 MED ORDER — DEXAMETHASONE SODIUM PHOSPHATE 4 MG/ML IJ SOLN
4 mg/mL | INTRAMUSCULAR | Status: AC
Start: 2016-01-11 — End: ?

## 2016-01-11 MED ORDER — GUM MASTIC-STORAX-METHYLSALICYLATE-ALCOHOL IN A DROPPERETTE
Status: AC
Start: 2016-01-11 — End: ?

## 2016-01-11 MED ORDER — LIDOCAINE HCL 1 % (10 MG/ML) IJ SOLN
10 mg/mL (1 %) | Freq: Once | INTRAMUSCULAR | Status: DC | PRN
Start: 2016-01-11 — End: 2016-01-11

## 2016-01-11 MED ORDER — SODIUM CHLORIDE 0.9 % IJ SYRG
INTRAMUSCULAR | Status: DC | PRN
Start: 2016-01-11 — End: 2016-01-11

## 2016-01-11 MED FILL — LIDOCAINE HCL 1 % (10 MG/ML) IJ SOLN: 10 mg/mL (1 %) | INTRAMUSCULAR | Qty: 20

## 2016-01-11 MED FILL — DIPRIVAN 10 MG/ML INTRAVENOUS EMULSION: 10 mg/mL | INTRAVENOUS | Qty: 20

## 2016-01-11 MED FILL — FENTANYL CITRATE (PF) 50 MCG/ML IJ SOLN: 50 mcg/mL | INTRAMUSCULAR | Qty: 2

## 2016-01-11 MED FILL — LIDOCAINE (PF) 20 MG/ML (2 %) IV SYRINGE: 100 mg/5 mL (2 %) | INTRAVENOUS | Qty: 5

## 2016-01-11 MED FILL — SODIUM CHLORIDE 0.9 % IV PIGGY BACK: INTRAVENOUS | Qty: 100

## 2016-01-11 MED FILL — NAROPIN (PF) 5 MG/ML (0.5 %) INJECTION SOLUTION: 5 mg/mL (0. %) | INTRAMUSCULAR | Qty: 30

## 2016-01-11 MED FILL — GUM MASTIC-STORAX-METHYLSALICYLATE-ALCOHOL IN A DROPPERETTE: Qty: 1

## 2016-01-11 MED FILL — LACTATED RINGERS IV: INTRAVENOUS | Qty: 1000

## 2016-01-11 MED FILL — MIDAZOLAM 1 MG/ML IJ SOLN: 1 mg/mL | INTRAMUSCULAR | Qty: 2

## 2016-01-11 MED FILL — ONDANSETRON (PF) 4 MG/2 ML INJECTION: 4 mg/2 mL | INTRAMUSCULAR | Qty: 2

## 2016-01-11 MED FILL — DEXAMETHASONE SODIUM PHOSPHATE 4 MG/ML IJ SOLN: 4 mg/mL | INTRAMUSCULAR | Qty: 5

## 2016-01-11 NOTE — Op Note (Signed)
January 11, 2016  PREOPERATIVE DIAGNOSIS:  Myopathy  ??  POSTOPERATIVE DIAGNOSIS:  same  ??  PROCEDURE:  Muscle Biopsy thigh Left thigh  ??  INDICATION AND FINDINGS:  Muscle weakness left lower limb. Intraoperative findings showed  Muscle with some fatty changes  ??  SURGEON:  Dr. Guss Bundehalla  ??  ASSISTANT:  Surgical assistant.  ??  ANESTHESIA:  IV sedation + Lidocaine local infiltration  ??  SPECIMEN:  Muscle, thigh left  ??  BLOOD LOSS:  Minimal.  ??  DRAINS:  none  ??  COMPLICATION:  None.    PROCEDURE:  The patient received IV sedation. The left thigh was sterile prepped and draped, exposing the rectus femoris and vastus lateralis area. A transverse incision was then made approximately one and half inches. Subcutaneous dissection was performed and the muscular fascia of the rectus femoris was identified and transected.  The muscle was isolated between 2 ties of 3-0 Vicryl, transected and sent for pathology as a fresh specimen.    The fascia was closed with interrupted 0 Vicryl and skin was closed with 4-0 Monocryl. Dermabond was applied. Patient was then awoken and transferred to the recovery room stable. Case was discussed with family.

## 2016-01-11 NOTE — Op Note (Signed)
January 11, 2016  PREOPERATIVE DIAGNOSIS:  Myopathy  ??  POSTOPERATIVE DIAGNOSIS:  same  ??  PROCEDURE:  Muscle Biopsy thigh Left thigh  ??  INDICATION AND FINDINGS:  Muscle weakness left lower limb. Intraoperative findings showed  Muscle with some fatty changes  ??  SURGEON:  Dr. Daronte Shostak  ??  ASSISTANT:  Surgical assistant.  ??  ANESTHESIA:  IV sedation + Lidocaine local infiltration  ??  SPECIMEN:  Muscle, thigh left  ??  BLOOD LOSS:  Minimal.  ??  DRAINS:  none  ??  COMPLICATION:  None.    PROCEDURE:  The patient received IV sedation. The left thigh was sterile prepped and draped, exposing the rectus femoris and vastus lateralis area. A transverse incision was then made approximately one and half inches. Subcutaneous dissection was performed and the muscular fascia of the rectus femoris was identified and transected.  The muscle was isolated between 2 ties of 3-0 Vicryl, transected and sent for pathology as a fresh specimen.    The fascia was closed with interrupted 0 Vicryl and skin was closed with 4-0 Monocryl. Dermabond was applied. Patient was then awoken and transferred to the recovery room stable. Case was discussed with family.

## 2016-01-11 NOTE — H&P (Signed)
Chief Complaint  None recorded.  Patient's Care Team  Referring Provider: [name] MD: [address] [place], [place] [address], Ph [phone], Fax [phone] NPI: [id number]  Patient's Pharmacies  [id number] [facility]): Rebekah Bishop, Rebekah Bishop, Ph [phone], Fax [phone]  Vitals  12/07/2015 10:25 am  Ht: 5 ft 6 in  Wt: 169 lbs   BMI: 27.3  Allergies  Reviewed Allergies  NKDA  Medications  Reviewed Medications  ergocalciferol (vitamin D2) 50,000 unit capsule  50000 unit., start 09/04/2015  09/04/15   filled Rebekah Bishop  Flexeril 10 mg tablet  10 mg., start 10/17/2015  10/17/15   filled Rebekah Bishop  gabapentin 600 mg tablet  TK 1 T PO QHS  12/01/15   filled surescripts  metFORMIN 500 mg tablet  TK 1 T PO BID  11/08/15   filled surescripts  metFORMIN ER 500 mg tablet,extended release 24 hr  TK 1 T PO BID  12/04/15   filled surescripts  OneTouch Delica Lancets 33 gauge  USE TO CHECK BLOOD SUGARS BID  09/08/15   filled surescripts  OneTouch Ultra Test strips  USE TO CHECK BLOOD SUGARS BID  09/08/15   filled surescripts  OneTouch Ultra2 kit  USE TO CHECK BLOOD SUGAR TWICE DAILY  09/08/15   filled surescripts  traMADol 50 mg tablet  50 mg., start 10/17/2015  10/17/15   filled Rebekah Bishop  valACYclovir 500 mg tablet  TK 2 TS PO TID. FOR 5 DAYS  09/04/15   filled surescripts  Problems  Reviewed Problems  Genital herpes simplex - Onset: 10/21/2014  Overweight - Onset: 09/04/2015  Disorder of the peripheral nervous system - Onset: 09/04/2015  Acute upper respiratory infection - Onset: 11/25/2014  Urinary tract infectious disease - Onset: 04/20/2014  Disorder of menstruation - Onset: 10/21/2014  Cervical myelopathy - Onset: 10/18/2015  Lumbosacral radiculopathy - Onset: 10/17/2015  Lumbar radiculopathy - Onset: 10/18/2015  Fatigue - Onset: 10/21/2014  Decreased vitamin D - Onset: 09/04/2015  Screening status - Onset: 10/21/2014   Increased glucose level - Onset: 10/21/2014  Family History  Reviewed Family History  Sister - Diabetes mellitus  Social History  Reviewed Social History  Surgery Social History  Occupation: claim support specialist  Alcohol intake: None  Smoking Status: Never smoker  Marital status: Single  Advance directive: N  Is blood transfusion acceptable in an emergency?: Y  Exercise level: Occasional  Surgical History  Reviewed Surgical History  Tubal Ligation  Past Medical History  Reviewed Past Medical History  Screening  None recorded.  HPI  comes in with cramping lower legs, cramps thigh. Being w/u by Rebekah Bishop for a myopathic process. He asked me to do a muscle biopsy L leg.  States legs tired, hurt through day, cramp at night; occasional swelling ankles. Worse symptoms as the day progresses. Heaviness legs. Has tried compression hose with some success. (wears two pairs at a time, in fact). ? left inner foot spasm and numbness  ROS  Patient reports limited motion; right leg. She reports numbness and tingling.  Physical Exam  Patient is a 40 year old female.    Constitutional: General Appearance: healthy-appearing and well-nourished. Level of Distress: NAD. Ambulation: ambulating normally.    Psychiatric: Mental Status: normal mood and affect and active and alert.    Head: Head: normocephalic and atraumatic.    Neck: Neck: supple and trachea midline.    Lungs: Auscultation: breath sounds normal and good air  movement.    Cardiovascular: Heart Auscultation: normal S1 and S2 and RRR.    Abdomen: Inspection and Palpation: soft and non-distended.    Musculoskeletal:: Joints, Bones, and Muscles: normal movement of all extremities and no bony abnormalities. Extremities: no cyanosis or edema; mild tenderness both LE BK. (also states subjectively 'cold' BK, especially feet).    Neurologic: Cranial Nerves: grossly intact.    Skin: Inspection and palpation: good turgor.  Assessment / Plan   1. Myositis - will schedule for left thigh biopsy as requested.  M60.9: Myositis, unspecified  BIOPSY, MUSCLE (PROC)  Anesthesia: Conscious Sedation Duration of Procedure (hours): 30 min  Side: LEFT    2. Peripheral venous insufficiency - her symptoms suggest a crossover with venous insufficiency. I spoke with Rebekah Bishop in detail, who concurred that an ultrasound to rule this out could help as well.  I87.2: Venous insufficiency (chronic) (peripheral)  [place], DUPLEX, VENOUS, LOWER EXTREMITY

## 2016-01-11 NOTE — Anesthesia Pre-Procedure Evaluation (Signed)
Anesthetic History   No history of anesthetic complications            Review of Systems / Medical History  Patient summary reviewed, nursing notes reviewed and pertinent labs reviewed    Pulmonary  Within defined limits                 Neuro/Psych         Neuromuscular disease (LE neuropathy)     Cardiovascular  Within defined limits                Exercise tolerance: >4 METS     GI/Hepatic/Renal  Within defined limits              Endo/Other    Diabetes: well controlled, type 2    Obesity and arthritis (Chronic LBP)     Other Findings              Physical Exam    Airway  Mallampati: II           Cardiovascular  Regular rate and rhythm,  S1 and S2 normal,  no murmur, click, rub, or gallop             Dental  No notable dental hx       Pulmonary  Breath sounds clear to auscultation               Abdominal         Other Findings            Anesthetic Plan    ASA: 2  Anesthesia type: MAC and total IV anesthesia          Induction: Intravenous  Anesthetic plan and risks discussed with: Patient

## 2016-01-11 NOTE — Other (Signed)
Went over [employer] instructions witih pt and sister, copy given to sister, rx given to sister

## 2016-01-11 NOTE — Anesthesia Post-Procedure Evaluation (Signed)
Post-Anesthesia Evaluation and Assessment    Patient: Rebekah Bishop MRN: [id number]  SSN: [id number]    Date of Birth: [birth year]  Age: 40 y.o.  Sex: female       Cardiovascular Function/Vital Signs  Visit Vitals   ??? BP 117/83   ??? Pulse 64   ??? Temp 36.1 ??C (97 ??F)   ??? Resp 14   ??? Ht 5\' 6"  (1.676 m)   ??? Wt 78.4 kg (172 lb 13.5 oz)   ??? SpO2 100%   ??? BMI 27.9 kg/m2       Patient is status post MAC, total IV anesthesia anesthesia for Procedure(s):  MUSCLE BIOPSY LEFT THIGH.    Nausea/Vomiting: None    Postoperative hydration reviewed and adequate.    Pain:  Pain Scale 1: Numeric (0 - 10) (01/11/16 1431)  Pain Intensity 1: 3 (01/11/16 1431)   Managed    Neurological Status:   Neuro (WDL): Within Defined Limits (01/11/16 1410)   At baseline    Mental Status and Level of Consciousness: Arousable    Pulmonary Status:   O2 Device: Nasal cannula (01/11/16 1350)   Adequate oxygenation and airway patent    Complications related to anesthesia: None    Post-anesthesia assessment completed. No concerns    Signed By: Therese Sarah, MD     January 11, 2016

## 2016-01-14 MED FILL — DEXAMETHASONE SODIUM PHOSPHATE 4 MG/ML IJ SOLN: 4 mg/mL | INTRAMUSCULAR | Qty: 8

## 2016-01-14 MED FILL — PROPOFOL 10 MG/ML IV EMUL: 10 mg/mL | INTRAVENOUS | Qty: 100

## 2016-01-14 MED FILL — ONDANSETRON (PF) 4 MG/2 ML INJECTION: 4 mg/2 mL | INTRAMUSCULAR | Qty: 4

## 2016-03-28 ENCOUNTER — Inpatient Hospital Stay: Admit: 2016-03-28 | Primary: Family Medicine

## 2016-03-28 LAB — T-SPOT TB TEST(EMPLOYEE)

## 2016-03-28 LAB — HEP B SURFACE AB
Hep B surface Ab Interp.: POSITIVE
Hepatitis B surface Ab: 68.22 m[IU]/mL (ref >10.0)

## 2016-03-28 LAB — HEPATITIS B SURFACE ANTIBODY
Hep B S Ab Interp: POSITIVE
Hep B S Ab: 68.22 m[IU]/mL (ref >10.0)

## 2016-03-29 LAB — VZV AB, IGG: V. zoster, IgG: 1602 index (ref >165)

## 2016-03-29 LAB — MUMPS AB, IGG: Mumps Abs, IgG: 28.5 AU/mL (ref >10.9)

## 2016-03-29 LAB — RUBELLA AB, IGG: Rubella IgG, QL: IMMUNE

## 2016-03-29 LAB — RUBEOLA AB, IGG: Rubeola Ab, IgG: 134 AU/mL (ref >29.9)

## 2017-11-25 ENCOUNTER — Encounter

## 2017-11-26 ENCOUNTER — Encounter

## 2017-11-26 ENCOUNTER — Inpatient Hospital Stay: Admit: 2017-11-26 | Payer: MEDICAID | Attending: Family Medicine | Primary: Family Medicine

## 2017-11-26 DIAGNOSIS — N644 Mastodynia: Secondary | ICD-10-CM

## 2017-11-28 ENCOUNTER — Inpatient Hospital Stay: Payer: MEDICAID | Attending: Family Medicine | Primary: Family Medicine

## 2017-12-02 ENCOUNTER — Ambulatory Visit: Payer: MEDICAID | Primary: Family Medicine

## 2017-12-03 ENCOUNTER — Inpatient Hospital Stay: Admit: 2017-12-03 | Payer: MEDICAID | Attending: Family Medicine | Primary: Family Medicine

## 2017-12-03 ENCOUNTER — Encounter

## 2017-12-03 DIAGNOSIS — R928 Other abnormal and inconclusive findings on diagnostic imaging of breast: Secondary | ICD-10-CM

## 2018-10-09 LAB — HEMOGLOBIN A1C
Estimated Avg Glucose, External: 113 mg/dL (ref 91–123)
Hemoglobin A1C, External: 5.6 % (ref 4.8–5.6)

## 2018-11-20 ENCOUNTER — Encounter

## 2019-03-30 ENCOUNTER — Encounter

## 2019-04-07 ENCOUNTER — Inpatient Hospital Stay: Admit: 2019-04-07 | Payer: MEDICAID | Attending: Neurology | Primary: Family Medicine

## 2019-04-07 DIAGNOSIS — M5417 Radiculopathy, lumbosacral region: Secondary | ICD-10-CM

## 2019-04-07 MED ORDER — IOPAMIDOL 61 % INTRATHECAL
61 % | Freq: Once | INTRATHECAL | Status: AC
Start: 2019-04-07 — End: 2019-04-07
  Administered 2019-04-07: 15:00:00 via EPIDURAL

## 2019-04-07 MED ORDER — LIDOCAINE (PF) 10 MG/ML (1 %) IJ SOLN
10 mg/mL (1 %) | INTRAMUSCULAR | Status: DC | PRN
Start: 2019-04-07 — End: 2019-04-07
  Administered 2019-04-07: 15:00:00 via SUBCUTANEOUS

## 2019-04-07 MED ORDER — BUPIVACAINE (PF) 0.5 % (5 MG/ML) IJ SOLN
0.5 % (5 mg/mL) | INTRAMUSCULAR | Status: DC | PRN
Start: 2019-04-07 — End: 2019-04-07
  Administered 2019-04-07: 15:00:00 via EPIDURAL

## 2019-04-07 MED ORDER — METHYLPREDNISOLONE 40 MG/ML SUSP FOR INJECTION
40 mg/mL | Freq: Once | INTRAMUSCULAR | Status: AC
Start: 2019-04-07 — End: 2019-04-07
  Administered 2019-04-07: 15:00:00 via EPIDURAL

## 2019-04-07 MED FILL — DEPO-MEDROL 40 MG/ML SUSPENSION FOR INJECTION: 40 mg/mL | INTRAMUSCULAR | Qty: 2

## 2019-04-07 MED FILL — XYLOCAINE-MPF 10 MG/ML (1 %) INJECTION SOLUTION: 10 mg/mL (1 %) | INTRAMUSCULAR | Qty: 5

## 2019-04-07 MED FILL — ISOVUE-M 300  61 % INTRATHECAL SOLUTION: 300 mg iodine /mL (61 %) | INTRATHECAL | Qty: 15

## 2019-04-07 MED FILL — SENSORCAINE-MPF 0.5 % (5 MG/ML) INJECTION SOLUTION: 0.5 % (5 mg/mL) | INTRAMUSCULAR | Qty: 10

## 2019-04-07 NOTE — H&P (Signed)
BRIEF HISTORY AND PHYSICAL      Clinical History:   Back pain    HPI:  Back pain, left lower extremity radiculopathy  Anticoagulation: None; patient denies pregnancy.    Allergies:      Allergies   Allergen Reactions   ??? Phenergan [Promethazine] Other (comments)     "jittery"       Current Meds:       No current facility-administered medications on file prior to encounter.      Current Outpatient Medications on File Prior to Encounter   Medication Sig Dispense Refill   ??? verapamiL (CALAN) 40 mg tablet Take  by mouth daily.     ??? gabapentin (NEURONTIN) 100 mg capsule Take 600 mg by mouth nightly as needed. 90 Cap 1   ??? Cholecalciferol, Vitamin D3, (VITAMIN D3) 2,000 unit cap Take 2,000 Units by mouth every seven (7) days. 90 Cap 3   ??? valACYclovir (VALTREX) 500 mg tablet Take 1 Tab by mouth daily as needed. 90 Tab 3   ??? HYDROcodone-acetaminophen (NORCO) 5-325 mg per tablet Take 1 Tab by mouth every four (4) hours as needed for Pain. Max Daily Amount: 6 Tabs. 25 Tab 0   ??? traMADol (ULTRAM) 50 mg tablet Take 50 mg by mouth every six (6) hours as needed for Pain.     ??? metFORMIN (GLUCOPHAGE) 500 mg tablet Take 500 mg by mouth two (2) times daily (with meals).     ??? cyclobenzaprine (FLEXERIL) 10 mg tablet Take 10 mg by mouth three (3) times daily as needed for Muscle Spasm(s).     ??? fluticasone (FLONASE) 50 mcg/actuation nasal spray 2 Sprays by Both Nostrils route daily. 1 Bottle 3           @mednamec @    Comorbid Conditions:    Past Medical History:   Diagnosis Date   ??? Allergic rhinitis, cause unspecified 08/09/2008   ??? Chronic pain     spine   ??? Diabetes ([place])    ??? Distal radius fracture, left Sept 2010   ??? 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 Headache    ??? Ill-defined condition    ??? Leg pain    ??? Morbid obesity ([place])    ??? Numbness and tingling of right leg    ??? Unspecified vitamin D deficiency 08/09/2008          Past Surgical History:   Procedure Laterality Date   ??? HX GYN      tubal ligation   ??? HX OTHER SURGICAL       novashur implant   ??? HX TUBAL LIGATION      bilat   ??? HX TUBAL LIGATION         Data:    Visit Vitals  BP 132/88 (BP 1 Location: Left arm, BP Patient Position: At rest;Supine)   Pulse 68   Temp 97.3 ??F (36.3 ??C)   Resp 14   Ht 5\' 6"  (1.676 m)   Wt 81.6 kg (180 lb)   BMI 29.05 kg/m??   :    No results found for: PTP, INR, APTT, INREXT    CBC w/Diff    Lab Results   Component Value Date/Time    WBC 7.2 05/24/2013 03:00 PM    RBC 4.63 05/24/2013 03:00 PM    HGB 13.5 05/24/2013 03:00 PM    HCT 41.3 05/24/2013 03:00 PM    MCV 89.2 05/24/2013 03:00 PM    MCH 29.2 05/24/2013 03:00 PM  MCHC 32.7 05/24/2013 03:00 PM    RDW 12.0 05/24/2013 03:00 PM    PLT 207 05/24/2013 03:00 PM        Lab Results   Component Value Date/Time    GRANS 55 05/24/2013 03:00 PM    LYMPH 38 05/24/2013 03:00 PM    MONOS 6 05/24/2013 03:00 PM    EOS 1 05/24/2013 03:00 PM    BASOS 0 05/24/2013 03:00 PM      Basic Metabolic Profile   Lab Results   Component Value Date    NA 145 05/24/2013    K 4.1 05/24/2013    CL 104 05/24/2013    CO2 32 05/24/2013    BUN 7 05/24/2013    CREA 0.70 05/24/2013    GLU 76 05/24/2013    CA 9.4 05/24/2013        Coagulation   No results found for: APTT, PTP, INR, INREXT      Lab Results   Component Value Date/Time    HCG urine, QL NEGATIVE  05/24/2013 02:00 PM    HCG urine, Ql. (POC) Negative 12/30/2008 12:35 PM        Physical Exam:     Back:  No redness, swelling    Impression:  Back pain, left lower extremity radiculopathy    Plan:  NPO  Consent        Daphane Shepherd, MD  April 07, 2019  10:13 AM  Vascular & Interventional Radiology

## 2019-04-07 NOTE — Progress Notes (Signed)
DISCHARGE SUMMARY from Nurse  ?  ?  PATIENT INSTRUCTIONS:  ?  Notify you Physician if you experience:  Increased Shortness of Breath   Dizziness   Fainting   Black Stools   Vomiting   Coughing White, Frothy or Bloody Sputum   Dry cough that will not go away   Increased swelling of arms, legs, ankles or abdomen   Develop a rash   Difficulty breathing while laying down   Urine problems: pain, burning, urgency or difficulty   Temperature greater than 100 degrees F, shaking, chills for more than 24 hours   Increased skin bruising   Sore Mouth, throat or gums   Increased cough, fatigue   Clicking/popping sound in a joint   Increased limb shortening or turning outward   ?  ?  Call you Doctor right away if you have new symptoms such as:  Cough that is worse at night and when you are lying down   Swelling in your legs, ankles, feet, abdomen and/or veins in the neck   ?  Lifestyle Tips:  Appointment after discharge and follow up phone call:  After being discharged, if your appointment does not work for you, please feel free to contact the physician's office to change the appointment.  We want to make sure you are doing well after you have left the hospital. You will receive a phone call on behalf of [facility] to follow up with you within 24-72 hours of your discharge from the hospital.  This phone call will help us provide you with the best possible care. Please take the time to answer all questions you are asked so we can help you stay safe in your home before your next doctor's appointment.  ?  Medications:  Take your medicines exactly as instructed.  Ask your doctor or nurse if you have questions about your medicines.  Tell your doctor right away if you have problems with your medicines.  Activity:  Ask your doctor how active you should be. Remember to take rest breaks.  Do not cross your legs when sitting.  Elevate your legs when you can.  Diet:  Follow any special diet orders from your  doctor.  Ask your doctor how much salt (sodium) you should have each day.  Ask if you need to limit the amount or types of fluids you drink each day.  Weight Monitoring:  If you are overweight, ask about a weight loss plan that is right for you.  Weight gain may mean that fluids are building up in your body.  Weigh yourself every day at about the same time and write it down.  Do Not Smoke:  If you smoke, quit. Smoking is bad for your health.  Avoid second hand smoke.  Call the [place] QUITLINE for help at 1-800-784-8669 (1-800-Quit - Now)  Other Tips:  Avoid people with the flu or pneumonia  Keep all of your doctor appointments  Carry a list of your medications, allergies and the shots you have had  ?  ?  ?  These are general instructions for a healthy lifestyle:  ?  No smoking/ No tobacco products/ Avoid exposure to second hand smoke  ?  Surgeon General's Warning: Quitting smoking now greatly reduces serious risk to your health.  ?  Obesity, smoking, and sedentary lifestyle greatly increases your risk for illness  ?  A healthy diet, regular physical exercise & weight monitoring are important for maintaining a healthy lifestyle  ?    You may be retaining fluid if you have a history of heart failure or if you experience any of the following symptoms: Weight gain of 3 pounds or more overnight or 5 pounds in a week, increased swelling in our hands or feet or shortness of breath while lying flat in bed. Please call your doctor as soon as you notice any of these symptoms; do not wait until your next office visit.  ?  Recognize signs and symptoms of STROKE:  ?  F-face looks uneven  ?  A-arms unable to move or move unevenly  ?  S-speech slurred or non-existent  ?  T-time-call 911 as soon as signs and symptoms begin-DO NOT go   Back to bed or wait to see if you get better-TIME IS BRAIN.  ?  Warning Signs of HEART ATTACK   ?  Call 911 if you have these symptoms:  Chest discomfort. Most heart attacks involve discomfort in the  center of the chest that lasts more than a few minutes, or that goes away and comes back. It can feel like uncomfortable pressure, squeezing, fullness, or pain.   Discomfort in other areas of the upper body. Symptoms can include pain or discomfort in one or both arms, the back, neck, jaw, or stomach.   Shortness of breath with or without chest discomfort.   Other signs may include breaking out in a cold sweat, nausea, or lightheadedness.   Don't wait more than five minutes to call 911 - MINUTES MATTER! Fast action can save your life. Calling 911 is almost always the fastest way to get lifesaving treatment. Emergency Medical Services staff can begin treatment when they arrive -- up to an hour sooner than if someone gets to the hospital by car.   Myself and/or my family have received education about my diagnosis throughout my hospital stay. During my hospital stay, I and/or my family/caregiver were included in planning my care upon discharge. My needs were taken into consideration and I was included in my discharge planning. I had an opportunity to ask questions.   The discharge information has been reviewed with the patient. The patient verbalized understanding.  ?  ?  Discharge medications reviewed with the patient and appropriate educational materials and side effects teaching were provided. Pt left A&Ox4, VSS with all belongings. Pt left via wheelchair accompanied by cath lab holding staff and daughter.

## 2019-04-07 NOTE — Progress Notes (Signed)
VASCULAR & INTERVENTIONAL RADIOLOGY POST PROCEDURE NOTE     Preoperative Diagnosis:    Back pain, left lower extremity radiculopathy    Postoperative Diagnosis:  Same.    Assistant:  None.    Type of Anesthesia:   Local     Procedure/Description: Lumbar Epidural Injection    Findings:     At L4-L5 80 mg DepoMedrol and 15 mg 0.5 % Marcaine is administered    Estimated blood Loss: None    Drains:   None.    Complications:None    Rebekah Shepherd, MD  April 07, 2019  10:22 AM left

## 2019-04-07 NOTE — Progress Notes (Signed)
DISCHARGE SUMMARY from Nurse  ?  ?  PATIENT INSTRUCTIONS:  ?  Notify you Physician if you experience:  Increased Shortness of Breath   Dizziness   Fainting   Black Stools   Vomiting   Coughing White, Frothy or Bloody Sputum   Dry cough that will not go away   Increased swelling of arms, legs, ankles or abdomen   Develop a rash   Difficulty breathing while laying down   Urine problems: pain, burning, urgency or difficulty   Temperature greater than 100 degrees F, shaking, chills for more than 24 hours   Increased skin bruising   Sore Mouth, throat or gums   Increased cough, fatigue   Clicking/popping sound in a joint   Increased limb shortening or turning outward   ?  ?  Call you Doctor right away if you have new symptoms such as:  Cough that is worse at night and when you are lying down   Swelling in your legs, ankles, feet, abdomen and/or veins in the neck   ?  Lifestyle Tips:  Appointment after discharge and follow up phone call:  After being discharged, if your appointment does not work for you, please feel free to contact the physician's office to change the appointment.  We want to make sure you are doing well after you have left the hospital. You will receive a phone call on behalf of [facility] to follow up with you within 24-72 hours of your discharge from the hospital.  This phone call will help us provide you with the best possible care. Please take the time to answer all questions you are asked so we can help you stay safe in your home before your next doctor's appointment.  ?  Medications:  Take your medicines exactly as instructed.  Ask your doctor or nurse if you have questions about your medicines.  Tell your doctor right away if you have problems with your medicines.  Activity:  Ask your doctor how active you should be. Remember to take rest breaks.  Do not cross your legs when sitting.  Elevate your legs when you can.  Diet:  Follow any special diet orders from your doctor.   Ask your doctor how much salt (sodium) you should have each day.  Ask if you need to limit the amount or types of fluids you drink each day.  Weight Monitoring:  If you are overweight, ask about a weight loss plan that is right for you.  Weight gain may mean that fluids are building up in your body.  Weigh yourself every day at about the same time and write it down.  Do Not Smoke:  If you smoke, quit. Smoking is bad for your health.  Avoid second hand smoke.  Call the [place] QUITLINE for help at 1-800-784-8669 (1-800-Quit - Now)  Other Tips:  Avoid people with the flu or pneumonia  Keep all of your doctor appointments  Carry a list of your medications, allergies and the shots you have had  ?  ?  ?  These are general instructions for a healthy lifestyle:  ?  No smoking/ No tobacco products/ Avoid exposure to second hand smoke  ?  Surgeon General's Warning: Quitting smoking now greatly reduces serious risk to your health.  ?  Obesity, smoking, and sedentary lifestyle greatly increases your risk for illness  ?  A healthy diet, regular physical exercise & weight monitoring are important for maintaining a healthy lifestyle  ?    You may be retaining fluid if you have a history of heart failure or if you experience any of the following symptoms: Weight gain of 3 pounds or more overnight or 5 pounds in a week, increased swelling in our hands or feet or shortness of breath while lying flat in bed. Please call your doctor as soon as you notice any of these symptoms; do not wait until your next office visit.  ?  Recognize signs and symptoms of STROKE:  ?  F-face looks uneven  ?  A-arms unable to move or move unevenly  ?  S-speech slurred or non-existent  ?  T-time-call 911 as soon as signs and symptoms begin-DO NOT go   Back to bed or wait to see if you get better-TIME IS BRAIN.  ?  Warning Signs of HEART ATTACK   ?  Call 911 if you have these symptoms:   Chest discomfort. Most heart attacks involve discomfort in the center of the chest that lasts more than a few minutes, or that goes away and comes back. It can feel like uncomfortable pressure, squeezing, fullness, or pain.   Discomfort in other areas of the upper body. Symptoms can include pain or discomfort in one or both arms, the back, neck, jaw, or stomach.   Shortness of breath with or without chest discomfort.   Other signs may include breaking out in a cold sweat, nausea, or lightheadedness.   Don't wait more than five minutes to call 911 ??? MINUTES MATTER! Fast action can save your life. Calling 911 is almost always the fastest way to get lifesaving treatment. Emergency Medical Services staff can begin treatment when they arrive ??? up to an hour sooner than if someone gets to the hospital by car.   Myself and/or my family have received education about my diagnosis throughout my hospital stay. During my hospital stay, I and/or my family/caregiver were included in planning my care upon discharge. My needs were taken into consideration and I was included in my discharge planning. I had an opportunity to ask questions.   The discharge information has been reviewed with the patient. The patient verbalized understanding.  ?  ?  Discharge medications reviewed with the patient and appropriate educational materials and side effects teaching were provided. Pt left A&Ox4, VSS with all belongings. Pt left via wheelchair accompanied by cath lab holding staff and daughter.

## 2019-04-07 NOTE — Progress Notes (Signed)
VASCULAR & INTERVENTIONAL RADIOLOGY POST PROCEDURE NOTE     Preoperative Diagnosis:    Back pain, left lower extremity radiculopathy    Postoperative Diagnosis:  Same.    Assistant:  None.    Type of Anesthesia:   Local     Procedure/Description: Lumbar Epidural Injection    Findings:     At L4-L5 80 mg DepoMedrol and 15 mg 0.5 % Marcaine is administered    Estimated blood Loss: None    Drains:   None.    Complications:None    Rebekah Bishop M Ailany Koren, MD  April 07, 2019  10:22 AM left

## 2019-04-07 NOTE — H&P (Signed)
BRIEF HISTORY AND PHYSICAL      Clinical History:   Back pain    HPI:  Back pain, left lower extremity radiculopathy  Anticoagulation: None; patient denies pregnancy.    Allergies:      Allergies   Allergen Reactions   ??? Phenergan [Promethazine] Other (comments)     "jittery"       Current Meds:       No current facility-administered medications on file prior to encounter.      Current Outpatient Medications on File Prior to Encounter   Medication Sig Dispense Refill   ??? verapamiL (CALAN) 40 mg tablet Take  by mouth daily.     ??? gabapentin (NEURONTIN) 100 mg capsule Take 600 mg by mouth nightly as needed. 90 Cap 1   ??? Cholecalciferol, Vitamin D3, (VITAMIN D3) 2,000 unit cap Take 2,000 Units by mouth every seven (7) days. 90 Cap 3   ??? valACYclovir (VALTREX) 500 mg tablet Take 1 Tab by mouth daily as needed. 90 Tab 3   ??? HYDROcodone-acetaminophen (NORCO) 5-325 mg per tablet Take 1 Tab by mouth every four (4) hours as needed for Pain. Max Daily Amount: 6 Tabs. 25 Tab 0   ??? traMADol (ULTRAM) 50 mg tablet Take 50 mg by mouth every six (6) hours as needed for Pain.     ??? metFORMIN (GLUCOPHAGE) 500 mg tablet Take 500 mg by mouth two (2) times daily (with meals).     ??? cyclobenzaprine (FLEXERIL) 10 mg tablet Take 10 mg by mouth three (3) times daily as needed for Muscle Spasm(s).     ??? fluticasone (FLONASE) 50 mcg/actuation nasal spray 2 Sprays by Both Nostrils route daily. 1 Bottle 3           @mednamec @    Comorbid Conditions:    Past Medical History:   Diagnosis Date   ??? Allergic rhinitis, cause unspecified 08/09/2008   ??? Chronic pain     spine   ??? Diabetes ([place])    ??? Distal radius fracture, left Sept 2010   ??? 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 Headache    ??? Ill-defined condition    ??? Leg pain    ??? Morbid obesity ([place])    ??? Numbness and tingling of right leg    ??? Unspecified vitamin D deficiency 08/09/2008          Past Surgical History:   Procedure Laterality Date   ??? HX GYN      tubal ligation   ??? HX OTHER SURGICAL       novashur implant   ??? HX TUBAL LIGATION      bilat   ??? HX TUBAL LIGATION         Data:    Visit Vitals  BP 132/88 (BP 1 Location: Left arm, BP Patient Position: At rest;Supine)   Pulse 68   Temp 97.3 ??F (36.3 ??C)   Resp 14   Ht 5\' 6"  (1.676 m)   Wt 81.6 kg (180 lb)   BMI 29.05 kg/m??   :    No results found for: PTP, INR, APTT, INREXT    CBC w/Diff    Lab Results   Component Value Date/Time    WBC 7.2 05/24/2013 03:00 PM    RBC 4.63 05/24/2013 03:00 PM    HGB 13.5 05/24/2013 03:00 PM    HCT 41.3 05/24/2013 03:00 PM    MCV 89.2 05/24/2013 03:00 PM    MCH 29.2 05/24/2013 03:00 PM  MCHC 32.7 05/24/2013 03:00 PM    RDW 12.0 05/24/2013 03:00 PM    PLT 207 05/24/2013 03:00 PM        Lab Results   Component Value Date/Time    GRANS 55 05/24/2013 03:00 PM    LYMPH 38 05/24/2013 03:00 PM    MONOS 6 05/24/2013 03:00 PM    EOS 1 05/24/2013 03:00 PM    BASOS 0 05/24/2013 03:00 PM      Basic Metabolic Profile   Lab Results   Component Value Date    NA 145 05/24/2013    K 4.1 05/24/2013    CL 104 05/24/2013    CO2 32 05/24/2013    BUN 7 05/24/2013    CREA 0.70 05/24/2013    GLU 76 05/24/2013    CA 9.4 05/24/2013        Coagulation   No results found for: APTT, PTP, INR, INREXT      Lab Results   Component Value Date/Time    HCG urine, QL NEGATIVE  05/24/2013 02:00 PM    HCG urine, Ql. (POC) Negative 12/30/2008 12:35 PM        Physical Exam:     Back:  No redness, swelling    Impression:  Back pain, left lower extremity radiculopathy    Plan:  NPO  Consent        Kamani Lewter M Deari Sessler, MD  April 07, 2019  10:13 AM  Vascular & Interventional Radiology

## 2019-11-09 ENCOUNTER — Encounter

## 2019-11-24 ENCOUNTER — Inpatient Hospital Stay: Payer: MEDICAID | Attending: Specialist | Primary: Family Medicine

## 2019-12-24 ENCOUNTER — Emergency Department: Admit: 2019-12-24 | Payer: MEDICAID | Primary: Family Medicine

## 2019-12-24 ENCOUNTER — Inpatient Hospital Stay
Admit: 2019-12-24 | Discharge: 2019-12-25 | Disposition: A | Discharge status: Home / Self Care | Payer: MEDICAID | Attending: Emergency Medicine

## 2019-12-24 DIAGNOSIS — K439 Ventral hernia without obstruction or gangrene: Secondary | ICD-10-CM

## 2019-12-24 LAB — URINALYSIS W/ RFLX MICROSCOPIC
Bilirubin, Urine: NEGATIVE
Bilirubin: NEGATIVE
Blood, Urine: NEGATIVE
Blood: NEGATIVE
Glucose, Ur: NEGATIVE mg/dL
Glucose: NEGATIVE mg/dL
Ketone: NEGATIVE mg/dL
Ketones, Urine: NEGATIVE mg/dL
Nitrite, Urine: NEGATIVE
Nitrites: NEGATIVE
Protein, UA: NEGATIVE mg/dL
Protein: NEGATIVE mg/dL
Specific Gravity, UA: 1.012 (ref 1.005–1.030)
Specific gravity: 1.012 (ref 1.005–1.030)
Urobilinogen, UA, POCT: 1 EU/dL (ref 0.2–1.0)
Urobilinogen: 1 EU/dL (ref 0.2–1.0)
pH (UA): 6.5 (ref 5.0–8.0)
pH, UA: 6.5 (ref 5.0–8.0)

## 2019-12-24 LAB — HCG URINE, QL
HCG urine, QL: NEGATIVE
Pregnancy Test(Urn): NEGATIVE

## 2019-12-24 LAB — URINE MICROSCOPIC ONLY
WBC, UA: 0 /hpf (ref 0–4)
WBC: 0 /hpf (ref 0–4)

## 2019-12-24 MED ORDER — ACETAMINOPHEN 500 MG TAB
500 mg | Freq: Once | ORAL | Status: AC
Start: 2019-12-24 — End: 2019-12-24
  Administered 2019-12-24: 23:00:00 via ORAL

## 2019-12-24 MED ORDER — IBUPROFEN 600 MG TAB
600 mg | Freq: Once | ORAL | Status: AC
Start: 2019-12-24 — End: 2019-12-24
  Administered 2019-12-24: 23:00:00 via ORAL

## 2019-12-24 MED FILL — IBUPROFEN 600 MG TAB: 600 mg | ORAL | Qty: 1

## 2019-12-24 MED FILL — ACETAMINOPHEN 500 MG TAB: 500 mg | ORAL | Qty: 2

## 2019-12-24 NOTE — ED Provider Notes (Signed)
HPI   Patient is a 44 year old female who presents with concerns for a lump to her epigastric region.  Patient states that over the past month she has been having episodes of severe spasming pain lasting for approximately 15 minutes.  Worse when she is driving.  This any pain improves with stretching and moving her belly around.  She denies any associated nausea, vomiting, changes in her appetite, weight loss, change in her bowel habits, blood in her stool, dysuria, hematuria or increased urinary frequency.  Her last menstrual period was last month and was normal.  States there is no chance that she is pregnant.  She denies any history of abdominal surgeries, trauma.  The pain does not radiate up into her chest, cause any chest pain or difficulty breathing.  She does not feel it radiating into her back.  Yesterday after she had this episode of pain she felt her belly and the area where she is feeling discomfort to her anterior epigastric region she felt a lump and was concerned prompting her to come to the emergency department.  Past Medical History:   Diagnosis Date   ??? Allergic rhinitis, cause unspecified 08/09/2008   ??? Chronic pain     spine   ??? Diabetes (HCC)    ??? Distal radius fracture, left Sept 2010   ??? 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 Headache(784.0)    ??? Ill-defined condition    ??? Leg pain    ??? Morbid obesity (HCC)    ??? Numbness and tingling of right leg    ??? Unspecified vitamin D deficiency 08/09/2008       Past Surgical History:   Procedure Laterality Date   ??? HX GYN      tubal ligation   ??? HX OTHER SURGICAL      novashur implant   ??? HX TUBAL LIGATION      bilat   ??? HX TUBAL LIGATION     ??? IR INJ SPINE THER SUBST LUM/SAC W IMG  04/07/2019         Family History:   Problem Relation Age of Onset   ??? Thyroid Disease Sister    ??? Arthritis-osteo Sister    ??? Arthritis-osteo Mother    ??? Cancer Father        Social History     Socioeconomic History   ??? Marital status: SINGLE     Spouse name: Not on file   ??? Number of  children: Not on file   ??? Years of education: Not on file   ??? Highest education level: Not on file   Occupational History   ??? Not on file   Tobacco Use   ??? Smoking status: Never Smoker   ??? Smokeless tobacco: Never Used   Substance and Sexual Activity   ??? Alcohol use: No   ??? Drug use: Yes     Types: Prescription, OTC   ??? Sexual activity: Not on file   Other Topics Concern   ??? Not on file   Social History Narrative    ** Merged History Encounter **          Social Determinants of Health     Financial Resource Strain:    ??? Difficulty of Paying Living Expenses:    Food Insecurity:    ??? Worried About Programme researcher, broadcasting/film/video in the Last Year:    ??? Ran Out of Food in the Last Year:    Transportation Needs:    ??? Lack  of Transportation (Medical):    ??? Lack of Transportation (Non-Medical):    Physical Activity:    ??? Days of Exercise per Week:    ??? Minutes of Exercise per Session:    Stress:    ??? Feeling of Stress :    Social Connections:    ??? Frequency of Communication with Friends and Family:    ??? Frequency of Social Gatherings with Friends and Family:    ??? Attends Religious Services:    ??? Database administrator or Organizations:    ??? Attends Engineer, structural:    ??? Marital Status:    Intimate Programme researcher, broadcasting/film/video Violence:    ??? Fear of Current or Ex-Partner:    ??? Emotionally Abused:    ??? Physically Abused:    ??? Sexually Abused:          ALLERGIES: Phenergan [promethazine]    Review of Systems  Constitutional: No fever  HENT: No ear pain  Eyes: No change in vision  Respiratory: No SOB  Cardio: No chest pain  GI: No blood in stool  GU: No hematuria  MSK: No back pain  Skin: No rashes  Neuro: No headache    Vitals:    12/24/19 1802   BP: 124/83   Pulse: 82   Resp: 16   Temp: 98.6 ??F (37 ??C)   SpO2: 100%            Physical Exam   General: No acute distress  Head: Normocephalic, atraumatic  Psych: Cooperative and alert  Eyes: No scleral icterus, normal conjunctiva  ENT: Moist oral mucosa  Neck: Supple  CV: Regular rate and rhythm,  no pitting edema, palpable radial pulses  Pulm: Clear breath sounds bilaterally without any wheezing or rhonchi, normal respiratory rate  GI: Normal bowel sounds, soft, mild localized tenderness to an area of the upper epigastric region with, possible defect noted to anterior upper epigastric region without any bulging masses or hernias.  MSK: Moves all four extremities  Skin: No rashes  Neuro: Alert and conversive    MDM   Patient is a 44 year old female who presents with concerns of a mass to her epigastric region.  She is not specifically pointing at her xiphoid and I do feel what feels to be a possible mass, not consistent with stool to the area.  I am most concerned for hernia defect.  She has no concerning endings of intra-abdominal pathology or deep tenderness.  I not suspect pancreatic involvement.  We discussed different options moving forward and agreed on a CT scan to evaluate for the underlying structures.  Patient otherwise clinically appears well.  The symptoms been going on for the past month.  I not suspect a cardiac etiology.  Do not think she needs further work-up at this time.  Patient will be given Tylenol and ibuprofen for pain.     UA is unremarkable.  Pregnancy test is negative.    CT scan on my examination did have an area concerning for a possible very small hernia versus diastases recti, still pending radiology read.  This point in my shift patient signed out to the oncoming physician pending radiology read.  Overall my suspicion is patient is stable and will be discharged home.    Patient stable for discharge at this time.  Patient is in agreement with the plan to be discharged at this time.  All the patient's questions were answered.  Patient was given written instructions on the diagnosis, and states  understanding of the plan moving forward.  We did discuss important signs and symptoms that should prompt quick return to the emergency department.    Disposition: Patient was discharged  home in stable condition.  They will follow up with surgery    Prescriptions: None    Diagnosis: Acute dental pain, no diagnosis        Procedures

## 2019-12-24 NOTE — ED Notes (Signed)
 Patient states she thinks she is having muscle spasms in her abdomen periodically.  She states her stomach knots  Up and is painful.

## 2019-12-24 NOTE — ED Notes (Signed)
Report given to [place] at this time. No concerns or comments. Pt in stable condition.

## 2020-01-05 NOTE — Telephone Encounter (Signed)
Ms. Rebekah Bishop called stating she's decided to seek care elsewhere because she was not pleased with the care she received at [facility] ED states she felt rushed by the physician was told she has a hernia but when the work shift changed the physician who last saw her told her the radiology report was normal/no hernia.  Ms. Rebekah Bishop inquired how she may be able to access copy of radiology report I explained she may access information in [place].  Referral declined by patient.

## 2020-04-03 ENCOUNTER — Encounter

## 2020-04-03 ENCOUNTER — Inpatient Hospital Stay: Admit: 2020-04-03 | Payer: MEDICAID | Attending: Nurse Practitioner | Primary: Family Medicine

## 2020-04-03 DIAGNOSIS — Z1231 Encounter for screening mammogram for malignant neoplasm of breast: Secondary | ICD-10-CM

## 2020-04-07 ENCOUNTER — Encounter

## 2020-05-22 ENCOUNTER — Inpatient Hospital Stay: Admit: 2020-05-22 | Payer: MEDICAID | Attending: Nurse Practitioner | Primary: Family Medicine

## 2020-05-22 DIAGNOSIS — R928 Other abnormal and inconclusive findings on diagnostic imaging of breast: Secondary | ICD-10-CM

## 2020-08-29 ENCOUNTER — Encounter (HOSPITAL_BASED_OUTPATIENT_CLINIC_OR_DEPARTMENT_OTHER): Payer: Self-pay

## 2020-08-29 ENCOUNTER — Other Ambulatory Visit: Payer: Self-pay

## 2020-08-29 ENCOUNTER — Emergency Department (HOSPITAL_BASED_OUTPATIENT_CLINIC_OR_DEPARTMENT_OTHER): Payer: Medicaid - Out of State

## 2020-08-29 ENCOUNTER — Emergency Department (HOSPITAL_BASED_OUTPATIENT_CLINIC_OR_DEPARTMENT_OTHER)
Admission: EM | Admit: 2020-08-29 | Discharge: 2020-08-29 | Disposition: A | Discharge status: Home / Self Care | Payer: Medicaid - Out of State | Attending: Emergency Medicine | Admitting: Emergency Medicine

## 2020-08-29 DIAGNOSIS — Z8616 Personal history of COVID-19: Secondary | ICD-10-CM | POA: Insufficient documentation

## 2020-08-29 DIAGNOSIS — U071 COVID-19: Secondary | ICD-10-CM | POA: Diagnosis not present

## 2020-08-29 DIAGNOSIS — R059 Cough, unspecified: Secondary | ICD-10-CM | POA: Diagnosis present

## 2020-08-29 HISTORY — DX: Polyneuropathy, unspecified: G62.9

## 2020-08-29 HISTORY — DX: COVID-19: U07.1

## 2020-08-29 MED ORDER — HYDROXYZINE HCL 25 MG PO TABS: 25.0000 mg | ORAL_TABLET | Freq: Four times a day (QID) | ORAL | 0 refills | Status: AC

## 2020-08-29 MED ORDER — ACETAMINOPHEN 500 MG PO TABS
1000.0000 mg | ORAL_TABLET | Freq: Once | ORAL | Status: AC
  Administered 2020-08-29: 1000 mg via ORAL
  Filled 2020-08-29: qty 2

## 2020-08-29 MED ORDER — METHOCARBAMOL 500 MG PO TABS: 500.0000 mg | ORAL_TABLET | Freq: Two times a day (BID) | ORAL | 0 refills | Status: AC

## 2020-08-29 NOTE — Discharge Instructions (Addendum)
Please drink plenty of water, take Tylenol and ibuprofen as discussed below.  I also prescribed you a muscle relaxer called Robaxin.  You may use it at nighttim since it causes drowsiness and may help you sleep.  You may always return to the ER for any new or concerning symptoms  Please use Tylenol or ibuprofen for pain.  You may use 600 mg ibuprofen every 6 hours or 1000 mg of Tylenol every 6 hours.  You may choose to alternate between the 2.  This would be most effective.  Not to exceed 4 g of Tylenol within 24 hours.  Not to exceed 3200 mg ibuprofen 24 hours.    I have also prescribed you medication called hydroxyzine which I would like for you to take as prescribed.  For least the next few days this medication will help you relax and will also help with inflammation.

## 2020-08-29 NOTE — ED Notes (Signed)
Radiology at bedside for portable chest x-ray.

## 2020-08-29 NOTE — ED Triage Notes (Signed)
Pt c/o CP x 4 days-states she tested +covid 5/2-was asymptomatic but tested due to family member was +covid-tested neg covid 5/7-NAD-steady gait

## 2020-08-29 NOTE — ED Triage Notes (Signed)
Formatting of this note might be different from the original.  Pt c/o CP x 4 days-states she tested +covid 5/2-was asymptomatic but tested due to family member was +covid-tested neg covid 5/7-NAD-steady gait  Electronically signed by Andrey Spearman, RN at 08/29/2020  1:33 PM EDT

## 2020-08-29 NOTE — ED Provider Notes (Signed)
Formatting of this note is different from the original.  Images from the original note were not included.    [facility] EMERGENCY DEPARTMENT  Provider Note    CSN: [id number]  Arrival date & time: 08/29/20  1326        History  Chief Complaint   Patient presents with   ? Covid Positive     Rebekah Bishop is a 45 y.o. female.    HPI  Patient is a 45 year old female with past medical history significant for COVID-19 diagnosed 5/2 she states she has had very minimal symptoms.  She has had some occasional coughing but is largely asymptomatic.  She states that she does have some achy chest pain that feels sharp when she coughs.  She denies any hemoptysis.    No recent surgeries, hospitalization, long travel, hemoptysis, estrogen containing OCP, cancer history.  No unilateral leg swelling.  No history of PE or VTE.     She states she came to be checked out just to be safe because although she is vaccinated her symptoms have been mild having COVID-19 is definitely stressed her out.    She has not taken any medications prior to arrival.  She states that her symptoms are neither pleuritic nor exertional.      Past Medical History:   Diagnosis Date   ? COVID    ? Neuropathy      There are no problems to display for this patient.    Past Surgical History:   Procedure Laterality Date   ? LEG SURGERY     ? TUBAL LIGATION         OB History    No obstetric history on file.      No family history on file.    Social History     Tobacco Use   ? Smoking status: Never Smoker   ? Smokeless tobacco: Never Used   Substance Use Topics   ? Alcohol use: Never   ? Drug use: Never     Home Medications  Prior to Admission medications    Medication Sig Start Date End Date Taking? Authorizing Provider   hydrOXYzine (ATARAX/VISTARIL) 25 MG tablet Take 1 tablet (25 mg total) by mouth every 6 (six) hours for 10 days. 08/29/20 09/08/20 Yes Fondaw, Wylder S, PA   methocarbamol (ROBAXIN) 500 MG tablet Take 1 tablet (500 mg total) by mouth 2 (two)  times daily. 08/29/20  Yes Tedd Sias, PA     Allergies     Phenergan [promethazine]    Review of Systems    Review of Systems   Constitutional: Positive for fatigue. Negative for chills and fever.   HENT: Negative for congestion.    Eyes: Negative for pain.   Respiratory: Negative for cough and shortness of breath.    Cardiovascular: Positive for chest pain. Negative for leg swelling.   Gastrointestinal: Negative for abdominal pain, diarrhea, nausea and vomiting.   Genitourinary: Negative for dysuria.   Musculoskeletal: Negative for myalgias.   Skin: Negative for rash.   Neurological: Negative for dizziness and headaches.     Physical Exam  Updated Vital Signs  BP 122/78 (BP Location: Right Arm)   Pulse 92   Temp 98.4 F (36.9 C) (Oral)   Resp 18   Ht 5\' 7"  (1.702 m)   Wt 65.8 kg   LMP 08/21/2020   SpO2 100%   BMI 22.71 kg/m     Physical Exam  Vitals and nursing note reviewed.  Constitutional:       General: She is not in acute distress.     Comments: Pleasant well-appearing 45 year old.  In no acute distress.  Sitting comfortably in bed.  Able answer questions appropriately follow commands. No increased work of breathing. Speaking in full sentences.   HENT:      Head: Normocephalic and atraumatic.      Nose: Nose normal.      Mouth/Throat:      Mouth: Mucous membranes are moist.   Eyes:      General: No scleral icterus.  Cardiovascular:      Rate and Rhythm: Normal rate and regular rhythm.      Pulses: Normal pulses.      Heart sounds: Normal heart sounds.      Comments: Heart rate between 60 and 80.  Regular rate and rhythm no murmurs rubs or gallops.  Pulmonary:      Effort: Pulmonary effort is normal. No respiratory distress.      Breath sounds: No wheezing.   Abdominal:      Palpations: Abdomen is soft.      Tenderness: There is no abdominal tenderness. There is no guarding or rebound.   Musculoskeletal:      Cervical back: Normal range of motion and neck supple. No tenderness.      Right lower  leg: No edema.      Left lower leg: No edema.   Skin:     General: Skin is warm and dry.      Capillary Refill: Capillary refill takes less than 2 seconds.   Neurological:      Mental Status: She is alert. Mental status is at baseline.   Psychiatric:         Mood and Affect: Mood normal.         Behavior: Behavior normal.     ED Results / Procedures / Treatments    Labs  (all labs ordered are listed, but only abnormal results are displayed)  Labs Reviewed - No data to display    EKG  None    Radiology  DG Chest [facility] 1 View    Result Date: 08/29/2020  CLINICAL DATA:  Chest pain.  Reported recent COVID-19 positive EXAM: PORTABLE CHEST 1 VIEW COMPARISON:  None. FINDINGS: Lungs are clear. Heart size and pulmonary vascularity are normal. No adenopathy. There is mid to lower thoracic levoscoliosis. IMPRESSION: Lungs clear.  Heart size normal. Electronically Signed   By: Lowella Grip III M.D.   On: 08/29/2020 15:36     Procedures  Procedures     Medications Ordered in ED  Medications   acetaminophen (TYLENOL) tablet 1,000 mg (1,000 mg Oral Given 08/29/20 1504)     ED Course   I have reviewed the triage vital signs and the nursing notes.    Pertinent labs & imaging results that were available during my care of the patient were reviewed by me and considered in my medical decision making (see chart for details).    Patient is a well-appearing 45 year old female presented today with some chest tightness and pressure that is worse with coughing which causes some sharp chest pains.  Her vital signs are within normal limits she is PERC negative she denies any nausea diaphoresis or any symptoms concerning for cardiac cause of chest pain.  She has had COVID-19 for 8 days.  Her symptoms seem to be improving.    Provide patient with 1 dose of Tylenol we will recheck.  If symptoms  do not improve we will provide further work-up.    Clinical Course as of 08/29/20 1948   Tue Aug 29, 2020   1555 Patient's chest x-ray is unremarkable.  [WF]     Clinical Course User Index  [WF] Tedd Sias, [place]     Patient reassessed.  She states that her symptoms are dramatically improved.  She is not complaining of any pain presently.    Given she is completely asymptomatic at this time.  Will discharge home with Tylenol ibuprofen recommendations and strict return precautions.  She will follow-up with PCP.    MDM Rules/Calculators/A&P      All questions answered best my ability.    Rebekah Bishop was evaluated in Emergency Department on 08/29/2020 for the symptoms described in the history of present illness. She was evaluated in the context of the global COVID-19 pandemic, which necessitated consideration that the patient might be at risk for infection with the SARS-CoV-2 virus that causes COVID-19. Institutional protocols and algorithms that pertain to the evaluation of patients at risk for COVID-19 are in a state of rapid change based on information released by regulatory bodies including the CDC and federal and state organizations. These policies and algorithms were followed during the patient's care in the ED.     Final Clinical Impression(s) / ED Diagnoses  Final diagnoses:   [id number]     Rx / DC Orders  ED Discharge Orders          Ordered     methocarbamol (ROBAXIN) 500 MG tablet  2 times daily         08/29/20 1557     hydrOXYzine (ATARAX/VISTARIL) 25 MG tablet  Every 6 hours         08/29/20 1600             Tedd Sias, [place]  08/29/20 1952      Deno Etienne, DO  08/30/20 0700    Electronically signed by Deno Etienne, DO at 08/30/2020  7:00 AM EDT    Associated attestation - Deno Etienne, DO - 08/30/2020  7:00 AM EDT  Formatting of this note might be different from the original.  Attestation: Medical screening examination/treatment/procedure(s) were performed by non-physician practitioner and as supervising physician I was immediately available for consultation/collaboration.

## 2020-08-29 NOTE — ED Notes (Signed)
Formatting of this note might be different from the original.  Radiology at bedside for portable chest xray   Electronically signed by Justice Britain, RN at 08/29/2020  3:03 PM EDT

## 2020-08-29 NOTE — ED Provider Notes (Signed)
[facility] EMERGENCY DEPARTMENT Provider Note   CSN: [id number] Arrival date & time: 08/29/20  1326     History Chief Complaint  Patient presents with  . Covid Positive    Kathryn Baird is a 45 y.o. female.  HPI Patient is a 45 year old female with past medical history significant for COVID-19 diagnosed 5/2 she states she has had very minimal symptoms.  She has had some occasional coughing but is largely asymptomatic.  She states that she does have some achy chest pain that feels sharp when she coughs.  She denies any hemoptysis.  No recent surgeries, hospitalization, long travel, hemoptysis, estrogen containing OCP, cancer history.  No unilateral leg swelling.  No history of PE or VTE.   She states she came to be checked out just to be safe because although she is vaccinated her symptoms have been mild having COVID-19 is definitely stressed her out.  She has not taken any medications prior to arrival.  She states that her symptoms are neither pleuritic nor exertional.    Past Medical History:  Diagnosis Date  . COVID   . Neuropathy     There are no problems to display for this patient.   Past Surgical History:  Procedure Laterality Date  . LEG SURGERY    . TUBAL LIGATION       OB History   No obstetric history on file.     No family history on file.  Social History   Tobacco Use  . Smoking status: Never Smoker  . Smokeless tobacco: Never Used  Substance Use Topics  . Alcohol use: Never  . Drug use: Never    Home Medications Prior to Admission medications   Medication Sig Start Date End Date Taking? Authorizing Provider  hydrOXYzine (ATARAX/VISTARIL) 25 MG tablet Take 1 tablet (25 mg total) by mouth every 6 (six) hours for 10 days. 08/29/20 09/08/20 Yes Taj Arteaga S, PA  methocarbamol (ROBAXIN) 500 MG tablet Take 1 tablet (500 mg total) by mouth 2 (two) times daily. 08/29/20  Yes Gailen Shelter, PA    Allergies    Phenergan  [promethazine]  Review of Systems   Review of Systems  Constitutional: Positive for fatigue. Negative for chills and fever.  HENT: Negative for congestion.   Eyes: Negative for pain.  Respiratory: Negative for cough and shortness of breath.   Cardiovascular: Positive for chest pain. Negative for leg swelling.  Gastrointestinal: Negative for abdominal pain, diarrhea, nausea and vomiting.  Genitourinary: Negative for dysuria.  Musculoskeletal: Negative for myalgias.  Skin: Negative for rash.  Neurological: Negative for dizziness and headaches.    Physical Exam Updated Vital Signs BP 122/78 (BP Location: Right Arm)   Pulse 92   Temp 98.4 F (36.9 C) (Oral)   Resp 18   Ht 5\' 7"  (1.702 m)   Wt 65.8 kg   LMP 08/21/2020   SpO2 100%   BMI 22.71 kg/m   Physical Exam Vitals and nursing note reviewed.  Constitutional:      General: She is not in acute distress.    Comments: Pleasant well-appearing 45 year old.  In no acute distress.  Sitting comfortably in bed.  Able answer questions appropriately follow commands. No increased work of breathing. Speaking in full sentences.  HENT:     Head: Normocephalic and atraumatic.     Nose: Nose normal.     Mouth/Throat:     Mouth: Mucous membranes are moist.  Eyes:     General: No scleral icterus. Cardiovascular:  Rate and Rhythm: Normal rate and regular rhythm.     Pulses: Normal pulses.     Heart sounds: Normal heart sounds.     Comments: Heart rate between 60 and 80.  Regular rate and rhythm no murmurs rubs or gallops. Pulmonary:     Effort: Pulmonary effort is normal. No respiratory distress.     Breath sounds: No wheezing.  Abdominal:     Palpations: Abdomen is soft.     Tenderness: There is no abdominal tenderness. There is no guarding or rebound.  Musculoskeletal:     Cervical back: Normal range of motion and neck supple. No tenderness.     Right lower leg: No edema.     Left lower leg: No edema.  Skin:    General: Skin  is warm and dry.     Capillary Refill: Capillary refill takes less than 2 seconds.  Neurological:     Mental Status: She is alert. Mental status is at baseline.  Psychiatric:        Mood and Affect: Mood normal.        Behavior: Behavior normal.     ED Results / Procedures / Treatments   Labs (all labs ordered are listed, but only abnormal results are displayed) Labs Reviewed - No data to display  EKG None  Radiology DG Chest [facility] 1 View  Result Date: 08/29/2020 CLINICAL DATA:  Chest pain.  Reported recent COVID-19 positive EXAM: PORTABLE CHEST 1 VIEW COMPARISON:  None. FINDINGS: Lungs are clear. Heart size and pulmonary vascularity are normal. No adenopathy. There is mid to lower thoracic levoscoliosis. IMPRESSION: Lungs clear.  Heart size normal. Electronically Signed   By: Bretta Bang III M.D.   On: 08/29/2020 15:36    Procedures Procedures   Medications Ordered in ED Medications  acetaminophen (TYLENOL) tablet 1,000 mg (1,000 mg Oral Given 08/29/20 1504)    ED Course  I have reviewed the triage vital signs and the nursing notes.  Pertinent labs & imaging results that were available during my care of the patient were reviewed by me and considered in my medical decision making (see chart for details).  Patient is a well-appearing 45 year old female presented today with some chest tightness and pressure that is worse with coughing which causes some sharp chest pains.  Her vital signs are within normal limits she is PERC negative she denies any nausea diaphoresis or any symptoms concerning for cardiac cause of chest pain.  She has had COVID-19 for 8 days.  Her symptoms seem to be improving.  Provide patient with 1 dose of Tylenol we will recheck.  If symptoms do not improve we will provide further work-up.  Clinical Course as of 08/29/20 1948  Tue Aug 29, 2020  1555 Patient's chest x-ray is unremarkable. [WF]    Clinical Course User Index [WF] Gailen Shelter, [place]    Patient reassessed.  She states that her symptoms are dramatically improved.  She is not complaining of any pain presently.  Given she is completely asymptomatic at this time.  Will discharge home with Tylenol ibuprofen recommendations and strict return precautions.  She will follow-up with PCP.  MDM Rules/Calculators/A&P                          All questions answered best my ability.  Kathryn Baird was evaluated in Emergency Department on 08/29/2020 for the symptoms described in the history of present illness. She was evaluated in the context  of the global COVID-19 pandemic, which necessitated consideration that the patient might be at risk for infection with the SARS-CoV-2 virus that causes COVID-19. Institutional protocols and algorithms that pertain to the evaluation of patients at risk for COVID-19 are in a state of rapid change based on information released by regulatory bodies including the CDC and federal and state organizations. These policies and algorithms were followed during the patient's care in the ED.   Final Clinical Impression(s) / ED Diagnoses Final diagnoses:  COVID-19    Rx / DC Orders ED Discharge Orders         Ordered    methocarbamol (ROBAXIN) 500 MG tablet  2 times daily        08/29/20 1557    hydrOXYzine (ATARAX/VISTARIL) 25 MG tablet  Every 6 hours        08/29/20 1600           Gailen Shelter, [place] 08/29/20 1952    Melene Plan, DO 08/30/20 0700

## 2020-11-08 ENCOUNTER — Encounter

## 2020-11-17 ENCOUNTER — Inpatient Hospital Stay: Admit: 2020-11-17 | Payer: MEDICAID | Attending: Nurse Practitioner | Primary: Family Medicine

## 2020-11-17 DIAGNOSIS — N6002 Solitary cyst of left breast: Secondary | ICD-10-CM

## 2021-02-06 ENCOUNTER — Encounter

## 2021-05-12 ENCOUNTER — Emergency Department: Admit: 2021-05-12 | Payer: MEDICAID | Primary: Family Medicine

## 2021-05-12 ENCOUNTER — Inpatient Hospital Stay
Admit: 2021-05-12 | Discharge: 2021-05-12 | Disposition: A | Discharge status: Home / Self Care | Payer: MEDICAID | Attending: Student in an Organized Health Care Education/Training Program

## 2021-05-12 DIAGNOSIS — G8929 Other chronic pain: Secondary | ICD-10-CM

## 2021-05-12 DIAGNOSIS — M5441 Lumbago with sciatica, right side: Secondary | ICD-10-CM

## 2021-05-12 LAB — CBC WITH AUTOMATED DIFF
BASOPHILS: 0.6 % (ref 0–3)
EOSINOPHILS: 0.9 % (ref 0–5)
HCT: 44.3 % (ref 37.0–50.0)
HGB: 14.6 gm/dl (ref 13.0–17.2)
IMMATURE GRANULOCYTES: 0.2 % (ref 0.0–3.0)
LYMPHOCYTES: 40.7 % (ref 28–48)
MCH: 29.2 pg (ref 25.4–34.6)
MCHC: 33 gm/dl (ref 30.0–36.0)
MCV: 88.6 fL (ref 80.0–98.0)
MONOCYTES: 5 % (ref 1–13)
MPV: 9.6 fL (ref 6.0–10.0)
NEUTROPHILS: 52.6 % (ref 34–64)
NRBC: 0 (ref 0–0)
PLATELET: 233 10*3/uL (ref 140–450)
RBC: 5 M/uL (ref 3.60–5.20)
RDW-SD: 37.8 (ref 36.4–46.3)
WBC: 5.4 10*3/uL (ref 4.0–11.0)

## 2021-05-12 LAB — METABOLIC PANEL, BASIC
Anion gap: 7 mmol/L (ref 5–15)
BUN: 6 mg/dl — ABNORMAL LOW (ref 9–23)
CO2: 27 mEq/L (ref 20–31)
Calcium: 9.5 mg/dl (ref 8.7–10.4)
Chloride: 105 mEq/L (ref 98–107)
Creatinine: 0.63 mg/dl (ref 0.55–1.02)
GFR est AA: >60
GFR est non-AA: >60
Glucose: 97 mg/dl (ref 74–106)
Potassium: 4.1 mEq/L (ref 3.5–5.1)
Sodium: 139 mEq/L (ref 136–145)

## 2021-05-12 LAB — BASIC METABOLIC PANEL
Anion Gap: 7 mmol/L (ref 5–15)
BUN: 6 mg/dl — ABNORMAL LOW (ref 9–23)
CO2: 27 mEq/L (ref 20–31)
Calcium: 9.5 mg/dl (ref 8.7–10.4)
Chloride: 105 mEq/L (ref 98–107)
Creatinine: 0.63 mg/dl (ref 0.55–1.02)
EGFR IF NonAfrican American: >60
GFR African American: >60
Glucose: 97 mg/dl (ref 74–106)
Potassium: 4.1 mEq/L (ref 3.5–5.1)
Sodium: 139 mEq/L (ref 136–145)

## 2021-05-12 LAB — CBC WITH AUTO DIFFERENTIAL
Basophils %: 0.6 % (ref 0–3)
Eosinophils %: 0.9 % (ref 0–5)
Hematocrit: 44.3 % (ref 37.0–50.0)
Hemoglobin: 14.6 gm/dl (ref 13.0–17.2)
Immature Granulocytes: 0.2 % (ref 0.0–3.0)
Lymphocytes %: 40.7 % (ref 28–48)
MCH: 29.2 pg (ref 25.4–34.6)
MCHC: 33 gm/dl (ref 30.0–36.0)
MCV: 88.6 fL (ref 80.0–98.0)
MPV: 9.6 fL (ref 6.0–10.0)
Monocytes %: 5 % (ref 1–13)
Neutrophils %: 52.6 % (ref 34–64)
Nucleated RBCs: 0 (ref 0–0)
Platelets: 233 10*3/uL (ref 140–450)
RBC: 5 M/uL (ref 3.60–5.20)
RDW-SD: 37.8 (ref 36.4–46.3)
WBC: 5.4 10*3/uL (ref 4.0–11.0)

## 2021-05-12 MED ORDER — SODIUM CHLORIDE 0.9 % IJ SYRG
Freq: Once | INTRAMUSCULAR | Status: DC
Start: 2021-05-12 — End: 2021-05-12

## 2021-05-12 NOTE — ED Notes (Signed)
I have reviewed discharge instructions with the patient.  The patient verbalized understanding. While discharging pt stated that she had the incorrect chief complaint written on her paperwork and that she would be filing a complaint

## 2021-05-12 NOTE — ED Triage Notes (Signed)
Formatting of this note might be different from the original.  Pt arrives amb aox4 through triage for pain in upper body and legs x 4-5 months. Pt denies injury. Pt has not seen PCP for this issue.   Electronically signed by Bunnie Domino, RN at [id number]  8:43 AM EST

## 2021-05-12 NOTE — ED Provider Notes (Signed)
Formatting of this note is different from the original.  [facility]  Emergency Department Treatment Report    Patient: Rebekah Bishop Age: 46 y.o. Sex: female    Date of Birth: February 10, 1976 Admit Date: 05/12/2021 PCP: Irena Cords, MD   MRN: [id number]  CSN: [id number]     Room: ER41/ER41 Time Dictated: 11:08 AM      Payor: Patience Musca MEDICAID / Plan: [place] / Product Type: Managed Care Medicaid /     Chief Complaint   Chief Complaint   Patient presents with    Back Pain     History of Present Illness   This is a 46 y.o. female with a history of chronic headaches, diabetes, peripheral neuropathy, and chronic back pain who presents with complaints of worsening back pain over the past several months.  She reports that she is concerned that "someone is using a radiation weapon" on her as her pain is significantly worsened and is not consistent with her previous back pain history.  She describes pain as an "electric shock" sensation that goes from the lower back into the bilateral lower extremities.  She reports that she has a history of peripheral neuropathy for which she is on Neurontin, but states that the symptoms are different and worsened.  She reports that she is seen by Dr. Vladimir Creeks of neurology for her chronic pain complaints and he is what he was prescribing her Neurontin.  She denies any specific injury.  She cannot specify why she is convinced that someone is using this "radiation weapon" on her and she does not have any specific person who she is accusing.  She reports her pain is currently "10/10" in severity.  She reports that she has pain that worsens throughout the day with specific activities, though she cannot specify what activities are causing her to worsen.  She reports that her pain seems to be much worse at night.    She reports prior imaging of her back for chronic back pain.  She is convinced that this is a new issue but she has not followed up  with her neurologist or primary care provider for further evaluation.    Review of Systems   Review of Systems   Constitutional:  Positive for chills and malaise/fatigue. Negative for fever.   HENT:  Negative for congestion, sinus pain and sore throat.    Eyes:  Negative for blurred vision and double vision.   Respiratory:  Negative for cough, hemoptysis, sputum production and shortness of breath.    Cardiovascular:  Positive for palpitations. Negative for chest pain and leg swelling.   Gastrointestinal:  Negative for abdominal pain, blood in stool, constipation, diarrhea, nausea and vomiting.   Genitourinary:  Positive for flank pain (bilateral). Negative for dysuria, frequency, hematuria and urgency.   Musculoskeletal:  Positive for back pain and joint pain (bilateral hips and thighs). Negative for neck pain.   Skin:  Negative for itching and rash.   Neurological:  Positive for tingling and sensory change. Negative for dizziness, focal weakness and headaches.        She reports chronic numbness and tingling in her lower extremities from peripheral neuropathy.  She reports that she does not have any new weakness or numbness.  She reports that her symptoms of "electric shock shooting pain" are not consistent with prior neuropathy symptoms.   Endo/Heme/Allergies:  Does not bruise/bleed easily.   Psychiatric/Behavioral:  Negative for depression and substance abuse. The patient  is nervous/anxious.      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    Headache(784.0)     Ill-defined condition     Leg pain     Morbid obesity (HCC)     Numbness and tingling of right leg     Unspecified vitamin D deficiency 08/09/2008     Past Surgical History:   Procedure Laterality Date    HX GYN      tubal ligation    HX OTHER SURGICAL      novashur  implant    HX TUBAL LIGATION      bilat    HX TUBAL LIGATION      IR INJ SPINE THER SUBST LUM/SAC W IMG  04/07/2019     Social History     Social History     Socioeconomic History    Marital status: SINGLE   Tobacco Use    Smoking status: Never    Smokeless tobacco: Never   Substance and Sexual Activity    Alcohol use: No    Drug use: Yes     Types: Prescription, OTC   Social History Narrative    ** Merged History Encounter **       Family History     Family History   Problem Relation Age of Onset    Thyroid Disease Sister     OSTEOARTHRITIS Sister     OSTEOARTHRITIS Mother     Cancer Father      Current Medications     Current Facility-Administered Medications   Medication Dose Route Frequency Provider Last Rate Last Admin    sodium chloride (NS) flush 5-10 mL  5-10 mL IntraVENous ONCE Leonides Cave, DO         Current Outpatient Medications   Medication Sig Dispense Refill    hydroCHLOROthiazide (HYDRODIURIL) 25 mg tablet Take 25 mg by mouth daily.      gabapentin (NEURONTIN) 100 mg capsule Take 600 mg by mouth nightly as needed. 90 Cap 1    Cholecalciferol, Vitamin D3, (VITAMIN D3) 2,000 unit cap Take 2,000 Units by mouth every seven (7) days. 90 Cap 3    valACYclovir (VALTREX) 500 mg tablet Take 1 Tab by mouth daily as needed. 90 Tab 3     Allergies     Allergies   Allergen Reactions    Phenergan [Promethazine] Other (comments)     "jittery"     Physical Exam     ED Triage Vitals [05/12/21 0844]   ED Encounter Vitals Group      BP (!) 139/99      Pulse (Heart Rate) 91      Resp Rate 17      Temp 98.1 F (36.7 C)      Temp src       O2 Sat (%) 100 %      Weight 142 lb      Height 5\' 7"      Patient Vitals for the past 24 hrs:   Temp Pulse Resp BP SpO2   05/12/21 0844 98.1 F (36.7 C) 91 17 (!) 139/99 100 %     I have reviewed documented vital signs, which are within age-appropriate parameters.   I have reviewed  nursing documentation.    Physical Exam  Vitals and nursing note reviewed.   Constitutional:        General: She is in acute distress (appears anxious and uncomfortable).   HENT:      Head: Normocephalic and atraumatic.      Right Ear: External ear normal.      Left Ear: External ear normal.      Nose: Nose normal.      Mouth/Throat:      Mouth: Mucous membranes are moist.      Pharynx: Oropharynx is clear.   Eyes:      Conjunctiva/sclera: Conjunctivae normal.   Cardiovascular:      Rate and Rhythm: Normal rate and regular rhythm.      Heart sounds: No murmur heard.  Pulmonary:      Effort: Pulmonary effort is normal. No respiratory distress.      Breath sounds: Normal breath sounds.   Abdominal:      General: There is no distension.      Palpations: Abdomen is soft.      Tenderness: There is no abdominal tenderness. There is right CVA tenderness and left CVA tenderness.   Musculoskeletal:         General: No tenderness or deformity.      Right lower leg: No edema.      Left lower leg: No edema.   Neurological:      Mental Status: She is alert and oriented to person, place, and time.      Sensory: No sensory deficit.      Motor: No weakness.      Deep Tendon Reflexes:      Reflex Scores:       Patellar reflexes are 2+ on the right side and 2+ on the left side.  Psychiatric:         Mood and Affect: Mood is anxious.     Impression and Management Plan   This is a 46 y.o. female with history of chronic lower back pain and peripheral neuropathy presents with complaints of worsening pain and concern for possible exposure to some sort of radiation weapon.  The patient seems to have some delusion about the sweating, but otherwise seems appropriate.  She is willing to admit the she is basing this on the fact that this seems different from her chronic back pain.  She does not seem to have any evidence of obvious psychosis or psychiatric etiology for the symptoms.  I feel that she is likely frustrated by neuropathic pain that is different from her chronic neuropathy and is likely related to some underlying herniation or  musculoskeletal etiology.  We discussed plan to obtain plain films and then after further discussion about her concerns for radiation added on a CBC and basic metabolic panel.  We feel that if there is any concern for radiation which is the patient's concern, she would have abnormalities on her CBC.  We discussed that this is somewhat limited and that there is really no additional work-up for a radiation weapon, as this is not open I am aware of existing.    Differential diagnoses: Radicular symptoms in setting of lumbar back pain, herniated disc, cauda equina, exposure to radiation, muscular strain, osteoarthritis, fibromyalgia, and other neuropathic pain etiologies among others.    Diagnostic Studies   Lab:   Recent Results (from the past 24 hour(s))   CBC WITH AUTOMATED DIFF    Collection Time: 05/12/21 10:25 AM   Result Value  Ref Range    WBC 5.4 4.0 - 11.0 1000/mm3    RBC 5.00 3.60 - 5.20 M/uL    HGB 14.6 13.0 - 17.2 gm/dl    HCT 44.3 37.0 - 50.0 %    MCV 88.6 80.0 - 98.0 fL    MCH 29.2 25.4 - 34.6 pg    MCHC 33.0 30.0 - 36.0 gm/dl    PLATELET 233 140 - 450 1000/mm3    MPV 9.6 6.0 - 10.0 fL    RDW-SD 37.8 36.4 - 46.3      NRBC 0 0 - 0      IMMATURE GRANULOCYTES 0.2 0.0 - 3.0 %    NEUTROPHILS 52.6 34 - 64 %    LYMPHOCYTES 40.7 28 - 48 %    MONOCYTES 5.0 1 - 13 %    EOSINOPHILS 0.9 0 - 5 %    BASOPHILS 0.6 0 - 3 %   METABOLIC PANEL, BASIC    Collection Time: 05/12/21 10:25 AM   Result Value Ref Range    Potassium 4.1 3.5 - 5.1 mEq/L    Chloride 105 98 - 107 mEq/L    Sodium 139 136 - 145 mEq/L    CO2 27 20 - 31 mEq/L    Glucose 97 74 - 106 mg/dl    BUN 6 (L) 9 - 23 mg/dl    Creatinine 0.63 0.55 - 1.02 mg/dl    GFR est AA >60.0      GFR est non-AA >60      Calcium 9.5 8.7 - 10.4 mg/dl    Anion gap 7 5 - 15 mmol/L     Based on my interpretation the CBC shows no evidence of significant anemia, leukocytosis, or other significant acute abnormalities.  Based on my interpretation the BMP shows no evidence of significant  electrolyte abnormalities, renal impairment, or other significant acute abnormalities.  I have reviewed all ordered labs and used them in my medical decision making (MDM) as documented below.    Imaging:    XR SPINE LUMB 2 OR 3 V    Result Date: 05/12/2021  Lumbar spine, 3 views. INDICATION:Lumbar back pain, shooting pain into bilateral legs. COMPARISON: No relevant studies FINDINGS: There is no compression deformity. 5 mm grade 1 anterolisthesis at L5-S1. Mild degenerative disc disease and facet arthropathy.     IMPRESSION: 1.  No acute compression deformity. 2.  Grade 1 anterolisthesis at L5-S1. Electronically signed by: Wallene Dales, MD 05/12/2021 10:36 AM EST     XR PELV 1 OR 2 V    Result Date: 05/12/2021  XR PELV 1 OR 2 V INDICATION: Bilateral hip pain, "bone pain". COMPARISON: No relevant studies. FINDINGS: There is no definitive fracture or dislocation. Alignment is preserved. Mild degenerative changes of bilateral hips. Surgical clips within the right hemipelvis.     IMPRESSION: No acute findings. Electronically signed by: Wallene Dales, MD 05/12/2021 10:36 AM EST      Based on my interpretation the plain films of the lumbar spine show grade 1 anterior listhesis of L5 on S1 without other evidence of acute fracture or dislocation.  Based on my interpretation the plain films of the pelvis and bilateral hips did not show any evidence of acute fracture, dislocation, or other acute abnormalities.    I have reviewed all ordered imaging studies and used them in my medical decision making (MDM) as documented below.    ED Course     RECORDS REVIEWED: I reviewed the patient's previous records here  at [facility] and available outside facilities and note that the patient had been seen several years ago for abdominal pain and chronic back pain complaints.  She has had a history of being jittery on prior examinations and has had noted anxiety without a prior diagnosis of anxiety syndrome.     EXTERNAL RESULTS REVIEWED: I reviewed an  outpatient note from neurology which documents her history of lumbar radiculopathy and paresthesias in the lower extremities.  She had previously been worked up with an EMG which showed changes consistent with radiculopathy but no myopathic changes.  It seems that this is very similar to the symptoms that she presents with today despite her stating that these are new.  This prior evaluation occurred in July 2020, so it is likely that the patient will need further outpatient follow-up with neurology for additional work-up.    Threat to body function without evaluation and management: Lumbar radiculopathy    SOCIAL DETERMINANTS impacting Evaluation and Management: Health services as the patient has had some difficulty arranging follow-up with her primary care provider or neurology over the past several months.    Comorbidities impacting Evaluation and Management: Chronic back pain, peripheral neuropathy, and lumbar radiculopathy    Medical Decision Making   Chief Complaint: Back Pain     46 y.o. female with back pain radiating into the lower extremities bilaterally with history and exam consistent with likely lumbar strain and bilateral sciatica.    Initial considerations in this patient included radicular symptoms in setting of lumbar back pain, herniated disc, cauda equina, exposure to radiation, muscular strain, osteoarthritis, fibromyalgia, and other neuropathic pain etiologies among others.    Patient presents with significant anxiety and discomfort related to lower back pain radiating into the bilateral lower extremities.  The patient was noted and no obvious focal deficits on exam.  The patient does have history of lumbar radiculopathy as well as peripheral neuropathy.  The patient reported the symptoms were different from her prior episodes of back pain.  She denied any new injury.  She reported that the symptoms have been going on for several months.  She reported some concern about being exposed to some  radiation weapon.  She seemed fixated on this idea despite not having other evidence of significant delusion.  She just seems generally concerned that this is very different from her prior pain and she has had a loss for why her pain is worsening despite her taking medications previously prescribed by neurology.  The patient was noted to have no focal neurologic deficits in the lower extremities.  She was noted to have normal reflexes in the bilateral lower extremities.  Plain films were obtained to rule out acute fracture and blood work was obtained after discussion of the patient's concern for radiation exposure.  Given that the CBC is unremarkable, we feel that radiation is unlikely.  Furthermore, the idea of a radiation weapon seems unlikely as a cause of the symptoms.  I do not feel that the patient has other evidence of underlying psychiatric illness, though she does have a history of noted anxiety with previous evaluations.  I offered the patient further treatment to include increasing her Neurontin dosing and adding on a muscle relaxant.  She refused saying that she does not think that this is related to her radicular symptoms or peripheral neuropathy.  We encouraged her to follow-up with her neurologist.  Prior to discharge she asked why she did not have an MRI during this visit and we explained  that with several months of pain and no evidence of focal neurologic deficits, it would not be appropriate to obtain an MRI through the ED.  We did agree that this is likely to be part of her work-up and encouraged her to follow-up with her primary care provider and neurology to arrange for further outpatient work-up to include MRI.    Prior to discharge we discussed return precautions, specifically for new focal neurologic deficits or worsening pain, treatment with continued use of previously prescribed medications including Neurontin, and follow up with primary care doctor and neurology for further evaluation, and  the patient demonstrated understanding and agreement with this plan.    Medications   sodium chloride (NS) flush 5-10 mL (has no administration in time range)     Final Diagnosis       ICD-10-CM ICD-9-CM   1. Chronic bilateral low back pain with bilateral sciatica  M54.42 724.2    M54.41 724.3    G89.29 338.29   2. History of peripheral neuropathy  Z86.69 V12.49     Disposition   Discharged home with recommendation for close outpatient follow-up.    Follow-up Information       Follow up With Specialties Details Why Contact Info    Crisostomo, Gaspar Garbe, MD Family Medicine In 1 week Follow up to discuss obtaining an MRI for further evaluation of back pain with bilateral sciatica symptoms. [place] [id number]  [phone]     Delora Fuel, MD Neurology In 1 week Follow up for further evaluation of lower back pain with bilateral sciatica. [place] [id number]  [phone]            Leonides Cave, DO  May 12, 2021    My signature above authenticates this document and my orders, the final    diagnosis (es), discharge prescription (s), and instructions in the Epic    record.  If you have any questions please contact [phone].    Nursing notes have been reviewed by the physician/ advanced practice    Clinician.    Dragon medical dictation software was used for portions of this report. Unintended voice recognition grammatical errors may occur.       Electronically signed by Leonides Cave, DO at 05/12/2021 11:36 AM EST

## 2021-05-12 NOTE — ED Notes (Signed)
Pt arrives amb aox4 through triage for pain in upper body and legs x 4-5 months. Pt denies injury. Pt has not seen PCP for this issue.

## 2021-05-12 NOTE — ED Notes (Signed)
Formatting of this note might be different from the original.  I have reviewed discharge instructions with the patient.  The patient verbalized understanding. While discharging pt stated that she had the incorrect chief complaint written on her paperwork and that she would be filing a complaint  Electronically signed by Oneta Rack, RN at 05/12/2021 12:16 PM EST

## 2021-05-12 NOTE — ED Provider Notes (Signed)
[facility]  Emergency Department Treatment Report    Patient: Rebekah Bishop Age: 46 y.o. Sex: female    Date of Birth: [birth year] Admit Date: 05/12/2021 PCP: Marsh Dollyrisostomo, Christen C, MD   MRN: [id number]  CSN: [id number]     Room: ER41/ER41 Time Dictated: 11:08 AM      Payor: [employer] [employer] / Plan: [facility] [employer] MEDICAID / Product Type: Managed Care Medicaid /     Chief Complaint   Chief Complaint   Patient presents with    Back Pain       History of Present Illness   This is a 46 y.o. female with a history of chronic headaches, diabetes, peripheral neuropathy, and chronic back pain who presents with complaints of worsening back pain over the past several months.  She reports that she is concerned that "someone is using a radiation weapon" on her as her pain is significantly worsened and is not consistent with her previous back pain history.  She describes pain as an "electric shock" sensation that goes from the lower back into the bilateral lower extremities.  She reports that she has a history of peripheral neuropathy for which she is on Neurontin, but states that the symptoms are different and worsened.  She reports that she is seen by Dr. Jake ChurchSteth of neurology for her chronic pain complaints and he is what he was prescribing her Neurontin.  She denies any specific injury.  She cannot specify why she is convinced that someone is using this "radiation weapon" on her and she does not have any specific person who she is accusing.  She reports her pain is currently "10/10" in severity.  She reports that she has pain that worsens throughout the day with specific activities, though she cannot specify what activities are causing her to worsen.  She reports that her pain seems to be much worse at night.    She reports prior imaging of her back for chronic back pain.  She is convinced that this is a new issue but she has not followed up with her neurologist or primary care provider for further  evaluation.    Review of Systems   Review of Systems   Constitutional:  Positive for chills and malaise/fatigue. Negative for fever.   HENT:  Negative for congestion, sinus pain and sore throat.    Eyes:  Negative for blurred vision and double vision.   Respiratory:  Negative for cough, hemoptysis, sputum production and shortness of breath.    Cardiovascular:  Positive for palpitations. Negative for chest pain and leg swelling.   Gastrointestinal:  Negative for abdominal pain, blood in stool, constipation, diarrhea, nausea and vomiting.   Genitourinary:  Positive for flank pain (bilateral). Negative for dysuria, frequency, hematuria and urgency.   Musculoskeletal:  Positive for back pain and joint pain (bilateral hips and thighs). Negative for neck pain.   Skin:  Negative for itching and rash.   Neurological:  Positive for tingling and sensory change. Negative for dizziness, focal weakness and headaches.        She reports chronic numbness and tingling in her lower extremities from peripheral neuropathy.  She reports that she does not have any new weakness or numbness.  She reports that her symptoms of "electric shock shooting pain" are not consistent with prior neuropathy symptoms.   Endo/Heme/Allergies:  Does not bruise/bleed easily.   Psychiatric/Behavioral:  Negative for depression and substance abuse. The patient is nervous/anxious.      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    Headache(784.0)     Ill-defined condition     Leg pain     Morbid obesity (HCC)     Numbness and tingling of right leg     Unspecified vitamin D deficiency 08/09/2008     Past Surgical History:   Procedure Laterality Date    HX GYN      tubal ligation    HX OTHER SURGICAL      novashur implant    HX TUBAL LIGATION      bilat    HX TUBAL LIGATION       IR INJ SPINE THER SUBST LUM/SAC W IMG  04/07/2019       Social History     Social History     Socioeconomic History    Marital status: SINGLE   Tobacco Use    Smoking status: Never    Smokeless tobacco: Never   Substance and Sexual Activity    Alcohol use: No    Drug use: Yes     Types: Prescription, OTC   Social History Narrative    ** Merged History Encounter **            Family History     Family History   Problem Relation Age of Onset    Thyroid Disease Sister     OSTEOARTHRITIS Sister     OSTEOARTHRITIS Mother     Cancer Father        Current Medications     Current Facility-Administered Medications   Medication Dose Route Frequency Provider Last Rate Last Admin    sodium chloride (NS) flush 5-10 mL  5-10 mL IntraVENous ONCE Johny Shock, DO         Current Outpatient Medications   Medication Sig Dispense Refill    hydroCHLOROthiazide (HYDRODIURIL) 25 mg tablet Take 25 mg by mouth daily.      gabapentin (NEURONTIN) 100 mg capsule Take 600 mg by mouth nightly as needed. 90 Cap 1    Cholecalciferol, Vitamin D3, (VITAMIN D3) 2,000 unit cap Take 2,000 Units by mouth every seven (7) days. 90 Cap 3    valACYclovir (VALTREX) 500 mg tablet Take 1 Tab by mouth daily as needed. 90 Tab 3       Allergies     Allergies   Allergen Reactions    Phenergan [Promethazine] Other (comments)     "jittery"       Physical Exam     ED Triage Vitals [05/12/21 0844]   ED Encounter Vitals Group      BP (!) 139/99      Pulse (Heart Rate) 91      Resp Rate 17      Temp 98.1 ??F (36.7 ??C)      Temp src       O2 Sat (%) 100 %      Weight 142 lb      Height 5\' 7"      Patient Vitals for the past 24 hrs:   Temp Pulse Resp BP SpO2   05/12/21 0844 98.1 ??F (36.7 ??C) 91 17 (!) 139/99 100 %     I have reviewed documented vital signs, which are within age-appropriate parameters.  I have reviewed nursing documentation.    Physical Exam  Vitals and nursing note reviewed.   Constitutional:       General: She is in acute distress (appears  anxious and uncomfortable).   HENT:      Head: Normocephalic and atraumatic.      Right Ear: External ear normal.      Left Ear: External ear normal.      Nose: Nose normal.      Mouth/Throat:      Mouth: Mucous membranes are moist.      Pharynx: Oropharynx is clear.   Eyes:      Conjunctiva/sclera: Conjunctivae normal.   Cardiovascular:      Rate and Rhythm: Normal rate and regular rhythm.      Heart sounds: No murmur heard.  Pulmonary:      Effort: Pulmonary effort is normal. No respiratory distress.      Breath sounds: Normal breath sounds.   Abdominal:      General: There is no distension.      Palpations: Abdomen is soft.      Tenderness: There is no abdominal tenderness. There is right CVA tenderness and left CVA tenderness.   Musculoskeletal:         General: No tenderness or deformity.      Right lower leg: No edema.      Left lower leg: No edema.   Neurological:      Mental Status: She is alert and oriented to person, place, and time.      Sensory: No sensory deficit.      Motor: No weakness.      Deep Tendon Reflexes:      Reflex Scores:       Patellar reflexes are 2+ on the right side and 2+ on the left side.  Psychiatric:         Mood and Affect: Mood is anxious.     Impression and Management Plan   This is a 46 y.o. female with history of chronic lower back pain and peripheral neuropathy presents with complaints of worsening pain and concern for possible exposure to some sort of radiation weapon.  The patient seems to have some delusion about the sweating, but otherwise seems appropriate.  She is willing to admit the she is basing this on the fact that this seems different from her chronic back pain.  She does not seem to have any evidence of obvious psychosis or psychiatric etiology for the symptoms.  I feel that she is likely frustrated by neuropathic pain that is different from her chronic neuropathy and is likely related to some underlying herniation or musculoskeletal etiology.  We discussed plan  to obtain plain films and then after further discussion about her concerns for radiation added on a CBC and basic metabolic panel.  We feel that if there is any concern for radiation which is the patient's concern, she would have abnormalities on her CBC.  We discussed that this is somewhat limited and that there is really no additional work-up for a radiation weapon, as this is not open I am aware of existing.    Differential diagnoses: Radicular symptoms in setting of lumbar back pain, herniated disc, cauda equina, exposure to radiation, muscular strain, osteoarthritis, fibromyalgia, and other neuropathic pain etiologies among others.    Diagnostic Studies   Lab:   Recent Results (from the past 24 hour(s))   CBC WITH AUTOMATED DIFF    Collection Time: 05/12/21 10:25 AM  Result Value Ref Range    WBC 5.4 4.0 - 11.0 1000/mm3    RBC 5.00 3.60 - 5.20 M/uL    HGB 14.6 13.0 - 17.2 gm/dl    HCT 60.4 54.0 - 98.1 %    MCV 88.6 80.0 - 98.0 fL    MCH 29.2 25.4 - 34.6 pg    MCHC 33.0 30.0 - 36.0 gm/dl    PLATELET 191 478 - 295 1000/mm3    MPV 9.6 6.0 - 10.0 fL    RDW-SD 37.8 36.4 - 46.3      NRBC 0 0 - 0      IMMATURE GRANULOCYTES 0.2 0.0 - 3.0 %    NEUTROPHILS 52.6 34 - 64 %    LYMPHOCYTES 40.7 28 - 48 %    MONOCYTES 5.0 1 - 13 %    EOSINOPHILS 0.9 0 - 5 %    BASOPHILS 0.6 0 - 3 %   METABOLIC PANEL, BASIC    Collection Time: 05/12/21 10:25 AM   Result Value Ref Range    Potassium 4.1 3.5 - 5.1 mEq/L    Chloride 105 98 - 107 mEq/L    Sodium 139 136 - 145 mEq/L    CO2 27 20 - 31 mEq/L    Glucose 97 74 - 106 mg/dl    BUN 6 (L) 9 - 23 mg/dl    Creatinine 6.21 3.08 - 1.02 mg/dl    GFR est AA >65.7      GFR est non-AA >60      Calcium 9.5 8.7 - 10.4 mg/dl    Anion gap 7 5 - 15 mmol/L     Based on my interpretation the CBC shows no evidence of significant anemia, leukocytosis, or other significant acute abnormalities.  Based on my interpretation the BMP shows no evidence of significant electrolyte abnormalities, renal impairment,  or other significant acute abnormalities.  I have reviewed all ordered labs and used them in my medical decision making (MDM) as documented below.    Imaging:    XR SPINE LUMB 2 OR 3 V    Result Date: 05/12/2021  Lumbar spine, 3 views. INDICATION:Lumbar back pain, shooting pain into bilateral legs. COMPARISON: No relevant studies FINDINGS: There is no compression deformity. 5 mm grade 1 anterolisthesis at L5-S1. Mild degenerative disc disease and facet arthropathy.     IMPRESSION: 1.  No acute compression deformity. 2.  Grade 1 anterolisthesis at L5-S1. Electronically signed by: Jodi Mourning, MD 05/12/2021 10:36 AM EST     XR PELV 1 OR 2 V    Result Date: 05/12/2021  XR PELV 1 OR 2 V INDICATION: Bilateral hip pain, "bone pain". COMPARISON: No relevant studies. FINDINGS: There is no definitive fracture or dislocation. Alignment is preserved. Mild degenerative changes of bilateral hips. Surgical clips within the right hemipelvis.     IMPRESSION: No acute findings. Electronically signed by: Jodi Mourning, MD 05/12/2021 10:36 AM EST      Based on my interpretation the plain films of the lumbar spine show grade 1 anterior listhesis of L5 on S1 without other evidence of acute fracture or dislocation.  Based on my interpretation the plain films of the pelvis and bilateral hips did not show any evidence of acute fracture, dislocation, or other acute abnormalities.    I have reviewed all ordered imaging studies and used them in my medical decision making (MDM) as documented below.    ED Course     RECORDS REVIEWED: I reviewed the patient's previous  [facility] and available outside facilities and note that the patient had been seen several years ago for abdominal pain and chronic back pain complaints.  She has had a history of being jittery on prior examinations and has had noted anxiety without a prior diagnosis of anxiety syndrome.     EXTERNAL RESULTS REVIEWED: I reviewed an outpatient note from neurology which documents  her history of lumbar radiculopathy and paresthesias in the lower extremities.  She had previously been worked up with an EMG which showed changes consistent with radiculopathy but no myopathic changes.  It seems that this is very similar to the symptoms that she presents with today despite her stating that these are new.  This prior evaluation occurred in July 2020, so it is likely that the patient will need further outpatient follow-up with neurology for additional work-up.    Threat to body function without evaluation and management: Lumbar radiculopathy    SOCIAL DETERMINANTS impacting Evaluation and Management: Health services as the patient has had some difficulty arranging follow-up with her primary care provider or neurology over the past several months.    Comorbidities impacting Evaluation and Management: Chronic back pain, peripheral neuropathy, and lumbar radiculopathy    Medical Decision Making   Chief Complaint: Back Pain     46 y.o. female with back pain radiating into the lower extremities bilaterally with history and exam consistent with likely lumbar strain and bilateral sciatica.    Initial considerations in this patient included radicular symptoms in setting of lumbar back pain, herniated disc, cauda equina, exposure to radiation, muscular strain, osteoarthritis, fibromyalgia, and other neuropathic pain etiologies among others.    Patient presents with significant anxiety and discomfort related to lower back pain radiating into the bilateral lower extremities.  The patient was noted and no obvious focal deficits on exam.  The patient does have history of lumbar radiculopathy as well as peripheral neuropathy.  The patient reported the symptoms were different from her prior episodes of back pain.  She denied any new injury.  She reported that the symptoms have been going on for several months.  She reported some concern about being exposed to some radiation weapon.  She seemed fixated on this idea  despite not having other evidence of significant delusion.  She just seems generally concerned that this is very different from her prior pain and she has had a loss for why her pain is worsening despite her taking medications previously prescribed by neurology.  The patient was noted to have no focal neurologic deficits in the lower extremities.  She was noted to have normal reflexes in the bilateral lower extremities.  Plain films were obtained to rule out acute fracture and blood work was obtained after discussion of the patient's concern for radiation exposure.  Given that the CBC is unremarkable, we feel that radiation is unlikely.  Furthermore, the idea of a radiation weapon seems unlikely as a cause of the symptoms.  I do not feel that the patient has other evidence of underlying psychiatric illness, though she does have a history of noted anxiety with previous evaluations.  I offered the patient further treatment to include increasing her Neurontin dosing and adding on a muscle relaxant.  She refused saying that she does not think that this is related to her radicular symptoms or peripheral neuropathy.  We encouraged her to follow-up with her neurologist.  Prior to discharge she asked why she did not have an MRI during this visit and  we explained that with several months of pain and no evidence of focal neurologic deficits, it would not be appropriate to obtain an MRI through the ED.  We did agree that this is likely to be part of her work-up and encouraged her to follow-up with her primary care provider and neurology to arrange for further outpatient work-up to include MRI.    Prior to discharge we discussed return precautions, specifically for new focal neurologic deficits or worsening pain, treatment with continued use of previously prescribed medications including Neurontin, and follow up with primary care doctor and neurology for further evaluation, and the patient demonstrated understanding and  agreement with this plan.    Medications   sodium chloride (NS) flush 5-10 mL (has no administration in time range)       Final Diagnosis       ICD-10-CM ICD-9-CM   1. Chronic bilateral low back pain with bilateral sciatica  M54.42 724.2    M54.41 724.3    G89.29 338.29   2. History of peripheral neuropathy  Z86.69 V12.49       Disposition   Discharged home with recommendation for close outpatient follow-up.      Follow-up Information       Follow up With Specialties Details Why Contact Info    Crisostomo, Peter Congo, MD Family Medicine In 1 week Follow up to discuss obtaining an MRI for further evaluation of back pain with bilateral sciatica symptoms. [address]  [address]  [place] [address]  [phone]      Vickey Huger, MD Neurology In 1 week Follow up for further evaluation of lower back pain with bilateral sciatica. [address]  [address]  [place] [address]  [phone]               Johny Shock, DO  May 12, 2021    My signature above authenticates this document and my orders, the final    diagnosis (es), discharge prescription (s), and instructions in the Epic    record.  If you have any questions please contact [phone].     Nursing notes have been reviewed by the physician/ advanced practice    Clinician.    Dragon medical dictation software was used for portions of this report. Unintended voice recognition grammatical errors may occur.

## 2022-07-09 ENCOUNTER — Emergency Department: Admit: 2022-07-10 | Primary: Family Medicine

## 2022-07-09 DIAGNOSIS — S8992XA Unspecified injury of left lower leg, initial encounter: Secondary | ICD-10-CM

## 2022-07-09 NOTE — Discharge Instructions (Signed)
Try ice pack first for the next 2 to 3 days and alternate with heat.  Try the oral anti-inflammatory prescribed.  Can alternate with extra strength Tylenol 500 mg to 1 g as well as the Voltaren gel prescribed.  Try stretching the knee in flexion and extension and putting weight with her toes initially.  Gradually advance to full weight as tolerated as the swelling and pain improves

## 2022-07-09 NOTE — ED Triage Notes (Signed)
Pt c/o left knee pain today with onset while she was ascending stairs at work.

## 2022-07-09 NOTE — ED Provider Notes (Signed)
EMERGENCY DEPARTMENT HISTORY AND PHYSICAL EXAM      Date: 07/09/2022  Patient Name: Rebekah Bishop      History of Presenting Illness     Chief Complaint   Patient presents with    Knee Pain       Location/Duration/Severity/Modifying factors   Chief Complaint   Patient presents with    Knee Pain       HPI:  Rebekah Bishop is a 47 y.o. female with PMH significant for noncontributory presents with left-sided knee pain, feels it is deep within the knee not externally, started today while walking up and down the stairs at work.  Unable to bear weight.  Denies any bruising or swelling, ground-level fall onto the knee.      PCP: Irena Cords, MD    Current Facility-Administered Medications   Medication Dose Route Frequency Provider Last Rate Last Admin    ibuprofen (ADVIL;MOTRIN) tablet 800 mg  800 mg Oral NOW Alveria Apley, DO         Current Outpatient Medications   Medication Sig Dispense Refill    ketorolac (TORADOL) 10 MG tablet Take 1 tablet by mouth every 6 hours as needed for Pain 20 tablet 0    diclofenac sodium (VOLTAREN) 1 % GEL Apply 2 g topically 4 times daily as needed for Pain 10 g 0    Cholecalciferol 50 MCG (2000 UT) CAPS Take 2,000 Units by mouth every 7 days      gabapentin (NEURONTIN) 100 MG capsule Take 600 mg by mouth.      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    Headache(784.0)     Ill-defined condition     Leg pain     Morbid obesity ([facility])     Numbness and tingling of right leg     Unspecified vitamin D deficiency 08/09/2008       Past Surgical History:  Past Surgical History:   Procedure Laterality Date    GYN       tubal ligation    IR INJ INTERLAMINAR LUMBAR/SAC  04/07/2019    OTHER SURGICAL HISTORY      novashur implant    TUBAL LIGATION      TUBAL LIGATION      bilat       Family History:  Family History   Problem Relation Age of Onset    Cancer Father     Osteoarthritis Mother     Thyroid Disease Sister     Osteoarthritis Sister        Social History:  Social History     Tobacco Use    Smoking status: Never    Smokeless tobacco: Never   Substance Use Topics    Alcohol use: No    Drug use: Yes     Types: OTC, Prescription       Allergies:  Allergies   Allergen Reactions    Promethazine Other (See Comments)     "jittery"  Physical Exam     General: Patient is awake and alert, in mild distress  Respiratory: Normal respiratory effort.   Skin: No visible ecchymosis, erythema to the left knee joint  MSK: No tenderness palpation of the bones, patella, visible left lateral knee effusion that is palpable, nontender however.  No ligament laxity with anterior posterior drawer testing.  Negative Apley's compression test  Neuro: The patient is alert and oriented.  Psych: Appropriate mood and affect.        Lab and Diagnostic Study Results     Labs -  No results found for this or any previous visit (from the past 24 hour(s)).    Radiologic Studies -   XR KNEE LEFT (3 VIEWS)    (Results Pending)         Procedures and Critical Care     Performed by: Alveria Apley, DO    Procedures     Alveria Apley, DO    Medical Decision Making and ED Course   - I am the first and primary provider for this patient AND AM THE PRIMARY PROVIDER OF RECORD.    - I reviewed the vital signs, available nursing notes, past medical history, past surgical history, family history and social history.    - Initial assessment performed. The patients presenting problems have been discussed, and the staff are in agreement with the care plan formulated and outlined with them.  I have encouraged them to ask questions as they arise throughout their visit.    Vital  Signs-Reviewed the patient's vital signs.    Patient Vitals for the past 12 hrs:   Temp Pulse Resp BP SpO2   07/09/22 2134 98.3 F (36.8 C) 85 18 (!) 145/89 99 %         Provider Notes (Medical Decision Making):          Clinical presentation most consistent with acute left knee contusion versus ligament sprain versus tear of the meniscus or other deeper.  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  Will apply crutches, knee immobilizer.  Offered intramuscular Toradol but declined.  She did accept 800 mg oral ibuprofen.  Knee films show no fracture, dislocation of the knee joint.  Will treat as soft tissue injury with supportive care measures, prescription for Toradol and Voltaren gel, orthopedic surgery follow-up.    Documentation/Prior Results Review:  Nursing notes    Social Determinants of Health: None identified      The patient will be discharged home. The patient was reassured that these symptoms do not appear to represent a serious or life threatening condition at this time. Warning signs of worsening condition were discusses and understood by the patient. Based on patient's age, coexisting illness, exam and the results of this ED evaluation, the decision to treat as an outpatient was made. Based on the information available at time of discharge, acute pathology requiring immediate intervention was deemed relative unlikely. While it is impossible to completely exclude the possibility of underlying serious disease or worsening condition, I feel the relative likelihood is extremely low. I discussed this uncertainty with the patient and/or family member/caregiver, who understood ED evaluation and treatment and felt comfortable with the outpatient treatment plan. All questions regarding care, test results and follow up were answered. At discharge, pt looked well, nontoxic, no distress, and is good candidate for outpatient follow up. They understand that they should return to the Emergency  Department for any new or worsening symptoms. I stressed the importance of follow  up for repeat assessment and possibly further evaluation/treatment.        Meds Given in ED:  Medications   ibuprofen (ADVIL;MOTRIN) tablet 800 mg (has no administration in time range)       Final Diagnosis:  1. Left knee injury, initial encounter        Disposition:  Destination: Discharge    Discharge Rx:   New Prescriptions    DICLOFENAC SODIUM (VOLTAREN) 1 % GEL    Apply 2 g topically 4 times daily as needed for Pain    KETOROLAC (TORADOL) 10 MG TABLET    Take 1 tablet by mouth every 6 hours as needed for Pain         Dictation disclaimer: Please note that this dictation was completed with Dragon, the computer voice recognition software. Quite often unanticipated grammatical, syntax, homophones, and other interpretive errors are inadvertently transcribed by the computer software. Please disregard these errors. Please excuse any errors that have escaped final proofreading.     Alveria Apley, D.O.  Emergency Physician  [facility]             [place], [place], [place]  07/09/22 2254

## 2022-07-10 ENCOUNTER — Inpatient Hospital Stay
Admit: 2022-07-10 | Discharge: 2022-07-10 | Disposition: A | Discharge status: Home / Self Care | Attending: Emergency Medicine

## 2022-07-10 MED ORDER — IBUPROFEN 400 MG PO TABS
400 | ORAL | Status: AC
Start: 2022-07-10 — End: 2022-07-09
  Administered 2022-07-10: 03:00:00 800 mg via ORAL

## 2022-07-10 MED ORDER — DICLOFENAC SODIUM 1 % EX GEL
1 | Freq: Four times a day (QID) | CUTANEOUS | 0 refills | Status: AC | PRN
Start: 2022-07-10 — End: ?

## 2022-07-10 MED ORDER — KETOROLAC TROMETHAMINE 10 MG PO TABS
10 | ORAL_TABLET | Freq: Four times a day (QID) | ORAL | 0 refills | Status: AC | PRN
Start: 2022-07-10 — End: ?

## 2022-07-10 MED FILL — IBUPROFEN 400 MG PO TABS: 400 MG | ORAL | Qty: 2

## 2022-09-14 IMAGING — DX DG CHEST 1V PORT
1 series · 1 of 1 positions shown · non-contrast
Comparison: None.

CLINICAL DATA: Chest pain.  Reported recent [id number] positive

EXAM:
PORTABLE CHEST 1 VIEW

[chest ap]
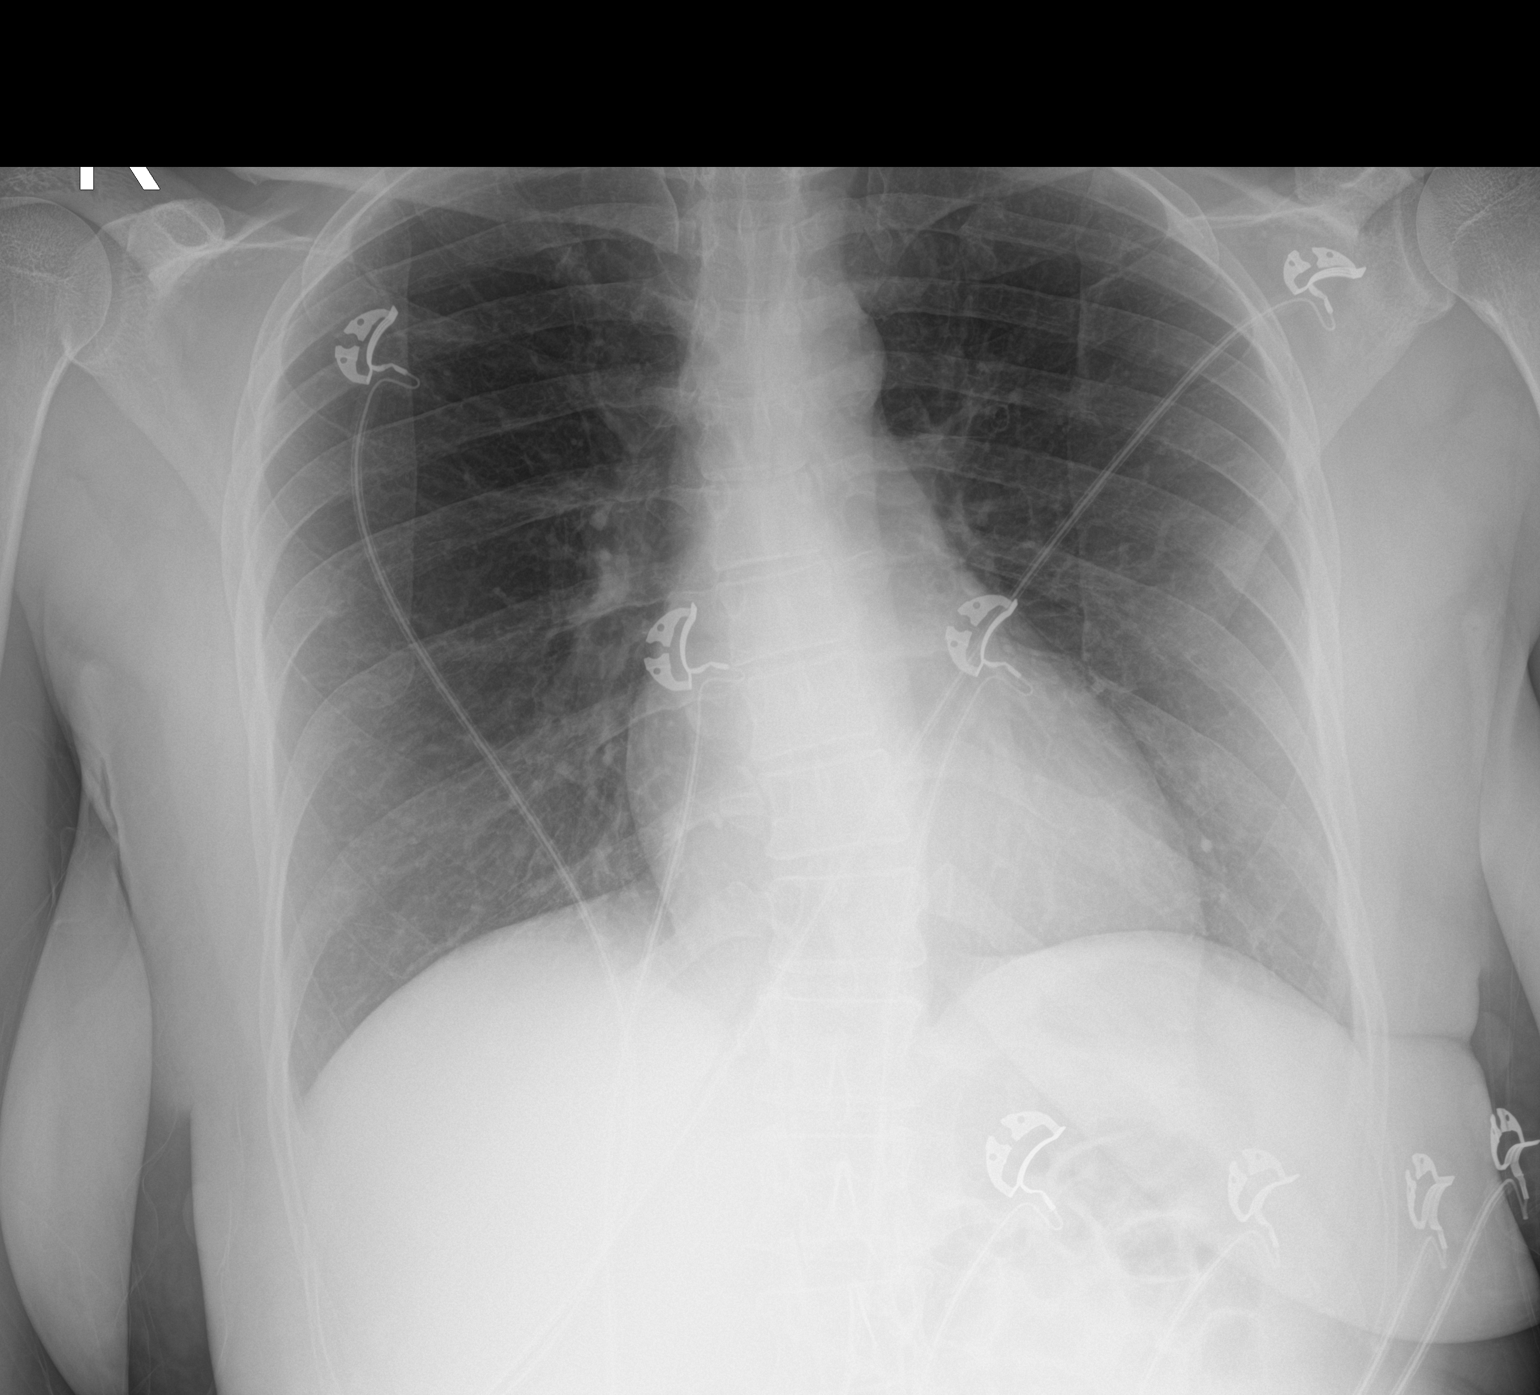

[1 of 1 positions shown; findings below may reference images not displayed]

FINDINGS: Lungs are clear. Heart size and pulmonary vascularity are normal. No
adenopathy. There is mid to lower thoracic levoscoliosis.
IMPRESSION: Lungs clear.  Heart size normal.
